# Patient Record
Sex: Female | Born: 1935 | Race: White | Hispanic: No | Smoking: Never smoker
Health system: Southern US, Community
[De-identification: ages and names within clinical notes are randomized; demographics above are authoritative.]

## PROBLEM LIST (undated history)

## (undated) DIAGNOSIS — T148XXA Other injury of unspecified body region, initial encounter: Secondary | ICD-10-CM

## (undated) DIAGNOSIS — H409 Unspecified glaucoma: Secondary | ICD-10-CM

## (undated) DIAGNOSIS — I10 Essential (primary) hypertension: Secondary | ICD-10-CM

## (undated) DIAGNOSIS — E785 Hyperlipidemia, unspecified: Secondary | ICD-10-CM

## (undated) HISTORY — DX: Other injury of unspecified body region, initial encounter: T14.8XXA

## (undated) HISTORY — PX: MOHS SURGERY: SUR867

## (undated) HISTORY — DX: Unspecified glaucoma: H40.9

## (undated) HISTORY — PX: HAND SURGERY: SHX662

## (undated) HISTORY — DX: Hyperlipidemia, unspecified: E78.5

## (undated) HISTORY — DX: Essential (primary) hypertension: I10

---

## 2005-04-20 ENCOUNTER — Ambulatory Visit: Payer: Self-pay | Admitting: Family Medicine

## 2005-07-15 ENCOUNTER — Ambulatory Visit: Payer: Self-pay | Admitting: Ophthalmology

## 2005-07-21 ENCOUNTER — Ambulatory Visit: Payer: Self-pay | Admitting: Ophthalmology

## 2006-06-16 ENCOUNTER — Ambulatory Visit: Payer: Self-pay | Admitting: Family Medicine

## 2009-01-08 ENCOUNTER — Ambulatory Visit: Payer: Self-pay | Admitting: Family Medicine

## 2009-05-23 DIAGNOSIS — L720 Epidermal cyst: Secondary | ICD-10-CM | POA: Insufficient documentation

## 2010-06-30 ENCOUNTER — Ambulatory Visit: Payer: Self-pay | Admitting: Family Medicine

## 2010-07-01 ENCOUNTER — Inpatient Hospital Stay: Payer: Self-pay | Admitting: Surgery

## 2010-07-08 LAB — PATHOLOGY REPORT

## 2010-08-12 ENCOUNTER — Ambulatory Visit: Payer: Self-pay | Admitting: Surgery

## 2010-12-21 ENCOUNTER — Ambulatory Visit: Payer: Self-pay | Admitting: Internal Medicine

## 2011-07-15 HISTORY — PX: CHOLECYSTECTOMY: SHX55

## 2013-06-28 ENCOUNTER — Ambulatory Visit: Payer: Self-pay | Admitting: Family Medicine

## 2013-07-09 ENCOUNTER — Ambulatory Visit: Payer: Self-pay | Admitting: Family Medicine

## 2013-07-30 ENCOUNTER — Ambulatory Visit: Payer: Self-pay | Admitting: Family Medicine

## 2013-07-30 HISTORY — PX: BREAST BIOPSY: SHX20

## 2013-08-15 ENCOUNTER — Ambulatory Visit (INDEPENDENT_AMBULATORY_CARE_PROVIDER_SITE_OTHER): Payer: Medicare Other | Admitting: General Surgery

## 2013-08-15 ENCOUNTER — Other Ambulatory Visit: Payer: Medicare Other

## 2013-08-15 ENCOUNTER — Encounter: Payer: Self-pay | Admitting: General Surgery

## 2013-08-15 VITALS — BP 132/70 | HR 70 | Resp 12 | Ht 65.0 in | Wt 135.0 lb

## 2013-08-15 DIAGNOSIS — N63 Unspecified lump in unspecified breast: Secondary | ICD-10-CM

## 2013-08-15 DIAGNOSIS — N6089 Other benign mammary dysplasias of unspecified breast: Secondary | ICD-10-CM

## 2013-08-15 DIAGNOSIS — N6082 Other benign mammary dysplasias of left breast: Secondary | ICD-10-CM

## 2013-08-15 NOTE — Progress Notes (Signed)
Patient ID: Felicia Weaver, female   DOB: 25-Dec-1924, 79 y.o.   MRN: [id number]  Chief Complaint  Patient presents with  . Breast Problem    left breast    HPI Felicia Weaver is a 79 y.o. female.  who presents for a breast evaluation referred by Dr Sullivan Lone. The most recent mammogram was 06-28-13.  She had a breast ultrasound done 07-09-13.  The patient then had a stereotatic left breast biopsy done 07-30-13.  Patient does perform regular self breast checks and her last regular mammogram was 2010.  She denied feeling anything in the breast and no breast injury. No family history breast cancer. T. 2014 was the first mammogram in 4 years.   HPI  Past Medical History  Diagnosis Date  . Glaucoma   . Fracture     right arm    Past Surgical History  Procedure Laterality Date  . Breast biopsy Left 07-30-13    stereotatic  . Cholecystectomy  Nov 2012    Family History  Problem Relation Age of Onset  . Hypertension Sister     Social History History  Substance Use Topics  . Smoking status: Never Smoker   . Smokeless tobacco: Not on file  . Alcohol Use: No    No Known Allergies  Current Outpatient Prescriptions  Medication Sig Dispense Refill  . aspirin 81 MG tablet Take 81 mg by mouth daily.      . Calcium-Magnesium-Vitamin D (CALCIUM 500 PO) Take by mouth daily.      . hydrochlorothiazide (HYDRODIURIL) 25 MG tablet Take 25 mg by mouth daily.      . Multiple Vitamin (MULTIVITAMIN) capsule Take 1 capsule by mouth daily.      . timolol (BETIMOL) 0.5 % ophthalmic solution Place 1 drop into the left eye daily.       No current facility-administered medications for this visit.    Review of Systems Review of Systems  Constitutional: Negative.   Respiratory: Negative.   Cardiovascular: Negative.     Blood pressure 132/70, pulse 70, resp. rate 12, height 5\' 5"  (1.651 m), weight 135 lb (61.236 kg).  Physical Exam Physical Exam  Constitutional: She is oriented to person,  place, and time. She appears well-developed and well-nourished.  Eyes: Conjunctivae are normal. No scleral icterus.  Neck: Neck supple.  Cardiovascular: Normal rate, regular rhythm and normal heart sounds.   Pulmonary/Chest: Effort normal and breath sounds normal. Right breast exhibits no inverted nipple, no mass, no nipple discharge, no skin change and no tenderness. Left breast exhibits no inverted nipple, no mass, no nipple discharge, no skin change and no tenderness.  Left breast with moderate amount of bruising noted.  Lymphadenopathy:    She has no cervical adenopathy.    She has no axillary adenopathy.  Neurological: She is alert and oriented to person, place, and time.  Skin: Skin is warm and dry.    Data Reviewed The 2014 and 2010 mammograms were reviewed. The area were the recent atypia was identified was not visualized on the 2010 study. Pathology shows focal flat epithelial atypia with microcalcification, fibrocystic changes and columnar cell changes.  Ultrasound examination of the left breast in the 3:00 position 5 cm from the nipple shows a well-defined biopsy cavity measuring 0.9 x 0.93 x 2.1 cm. The biopsy clip is evident.  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  Excellent functional status.     Plan    Options for management  were reviewed: 1) wide excision to confirm there is not a more sinister process present versus 2) followup mammograms to determine if further progression occurs.  The patient has an excellent functional status. She has several siblings who are in their 90s. The risks associated with surgery are modest and it may put her mind obese, if she were to defer an argument could be made for observation.  The patient's daughter, Felicia Weaver was present today for for the interview and exam. She and her mother will consider their options and notify the office followed like to proceed.        Felicia Weaver 08/17/2013, 9:02  PM

## 2013-08-15 NOTE — Patient Instructions (Addendum)
Continue self breast exams. Call office for any new breast issues or concerns. The patient will decide if she wishes to have breast surgery or to monitor the area and repeat the mammogram in 6 months.Felicia Weaver

## 2013-08-17 ENCOUNTER — Telehealth: Payer: Self-pay

## 2013-08-17 DIAGNOSIS — N6089 Other benign mammary dysplasias of unspecified breast: Secondary | ICD-10-CM | POA: Insufficient documentation

## 2013-08-17 NOTE — Telephone Encounter (Signed)
Patient's daughter called and states that the patient would like to go ahead and precede with the breast surgery now instead of waiting 6 months. Please write the order and give to [facility] and we will contact the patient to set up a surgery date. The patient would like to have her surgery done before Christmas.

## 2013-08-20 ENCOUNTER — Telehealth: Payer: Self-pay | Admitting: *Deleted

## 2013-08-20 NOTE — Telephone Encounter (Signed)
Patient's surgery has been scheduled for 08-28-13 at [facility]. It is okay for patient to continue 81 mg aspirin.  Daughter is aware of instructions and will pick up paperwork on Wednesday morning prior to pre-admit appointment.   Please complete orders so I can send to [facility]. Thanks.

## 2013-08-21 ENCOUNTER — Other Ambulatory Visit: Payer: Self-pay | Admitting: General Surgery

## 2013-08-21 DIAGNOSIS — N6082 Other benign mammary dysplasias of left breast: Secondary | ICD-10-CM

## 2013-08-21 NOTE — Telephone Encounter (Signed)
OK to continue ASA.

## 2013-08-22 ENCOUNTER — Ambulatory Visit: Payer: Self-pay | Admitting: General Surgery

## 2013-08-28 ENCOUNTER — Ambulatory Visit: Payer: Self-pay | Admitting: General Surgery

## 2013-08-28 DIAGNOSIS — D4860 Neoplasm of uncertain behavior of unspecified breast: Secondary | ICD-10-CM

## 2013-08-28 HISTORY — PX: BREAST BIOPSY: SHX20

## 2013-08-30 ENCOUNTER — Encounter: Payer: Self-pay | Admitting: General Surgery

## 2013-08-31 ENCOUNTER — Telehealth: Payer: Self-pay | Admitting: *Deleted

## 2013-08-31 NOTE — Telephone Encounter (Signed)
Patients daughter Alona Bene  has been notified as instructed. This patients daughter verbalizes understanding.Patient to follow up 09/04/13 at 1:45.

## 2013-08-31 NOTE — Telephone Encounter (Signed)
Message copied by Levada Schilling on Fri Aug 31, 2013  8:27 AM ------      Message from: Erick, Merrily Pew      Created: Fri Aug 31, 2013  7:28 AM       Please notify the patient that the breast biopsy from December 16 did not show any cancer. Will give further details during her followup appointment next week. No additional treatment is required. ------

## 2013-09-03 ENCOUNTER — Encounter: Payer: Self-pay | Admitting: General Surgery

## 2013-09-04 ENCOUNTER — Encounter: Payer: Self-pay | Admitting: General Surgery

## 2013-09-04 ENCOUNTER — Ambulatory Visit (INDEPENDENT_AMBULATORY_CARE_PROVIDER_SITE_OTHER): Payer: Medicare Other | Admitting: General Surgery

## 2013-09-04 VITALS — BP 124/68 | HR 72 | Resp 14 | Ht 65.0 in | Wt 137.0 lb

## 2013-09-04 DIAGNOSIS — N6082 Other benign mammary dysplasias of left breast: Secondary | ICD-10-CM

## 2013-09-04 DIAGNOSIS — N6089 Other benign mammary dysplasias of unspecified breast: Secondary | ICD-10-CM

## 2013-09-04 NOTE — Patient Instructions (Addendum)
Patient to return in one month. 

## 2013-09-04 NOTE — Progress Notes (Signed)
Patient ID: Felicia Weaver, female   DOB: [birth year], 79 y.o.   MRN: [id number]  Chief Complaint  Patient presents with  . Routine Post Op    left breast biopsy    HPI Felicia Weaver is a 79 y.o. female here today for her post op left breast biopsy done 08/28/13. No new breast issues.  States the left breast is "sore" but no pain.  HPI  Past Medical History  Diagnosis Date  . Glaucoma   . Fracture     right arm    Past Surgical History  Procedure Laterality Date  . Cholecystectomy  Nov 2012  . Breast biopsy Left 07-30-13    stereotatic  . Breast biopsy Left 08/28/13    benign    Family History  Problem Relation Age of Onset  . Hypertension Sister     Social History History  Substance Use Topics  . Smoking status: Never Smoker   . Smokeless tobacco: Not on file  . Alcohol Use: No    No Known Allergies  Current Outpatient Prescriptions  Medication Sig Dispense Refill  . aspirin 81 MG tablet Take 81 mg by mouth daily.      . Calcium-Magnesium-Vitamin D (CALCIUM 500 PO) Take by mouth daily.      . hydrochlorothiazide (HYDRODIURIL) 25 MG tablet Take 25 mg by mouth daily.      . Multiple Vitamin (MULTIVITAMIN) capsule Take 1 capsule by mouth daily.      . timolol (BETIMOL) 0.5 % ophthalmic solution Place 1 drop into the left eye daily.       No current facility-administered medications for this visit.    Review of Systems Review of Systems  Constitutional: Negative.   Respiratory: Negative.   Cardiovascular: Negative.     Blood pressure 124/68, pulse 72, resp. rate 14, height 5\' 5"  (1.651 m), weight 137 lb (62.143 kg).  Physical Exam Physical Exam  Constitutional: She is oriented to person, place, and time. She appears well-developed and well-nourished.  Eyes: No scleral icterus.  Pulmonary/Chest:  Left breast incision is healing well.  Lymphadenopathy:    She has no cervical adenopathy.  Neurological: She is alert and oriented to person, place, and time.   Skin: Skin is warm and dry.    Data Reviewed Flat epithelial atypia without severe dysplasia or malignancy. Surgical margins free of atypia.   Assessment    Doing well status post excisional biopsy.     Plan    followup examination one month.        [name] 09/04/2013, 9:03 PM

## 2013-10-10 ENCOUNTER — Encounter: Payer: Self-pay | Admitting: General Surgery

## 2013-10-10 ENCOUNTER — Ambulatory Visit (INDEPENDENT_AMBULATORY_CARE_PROVIDER_SITE_OTHER): Payer: Medicare HMO | Admitting: General Surgery

## 2013-10-10 VITALS — BP 132/70 | HR 70 | Resp 12 | Ht 65.0 in | Wt 134.0 lb

## 2013-10-10 DIAGNOSIS — R921 Mammographic calcification found on diagnostic imaging of breast: Secondary | ICD-10-CM

## 2013-10-10 DIAGNOSIS — R928 Other abnormal and inconclusive findings on diagnostic imaging of breast: Secondary | ICD-10-CM

## 2013-10-10 NOTE — Progress Notes (Signed)
Patient ID: Felicia Weaver, female   DOB: 10-Oct-1924, 80 y.o.   MRN: [id number]  Chief Complaint  Patient presents with  . Follow-up    HPI Felicia Weaver is a 80 y.o. female.  Here today for her follow up excisional left breast biopsy done 08-28-13. No new breast complaints. She does say the left breast is still tender.  HPI  Past Medical History  Diagnosis Date  . Glaucoma   . Fracture     right arm    Past Surgical History  Procedure Laterality Date  . Cholecystectomy  Nov 2012  . Breast biopsy Left 07-30-13    stereotatic  . Breast biopsy Left 08/28/13    benign/Flat epithelial atypia without severe dysplasia or malignancy. Surgical margins free of atypia    Family History  Problem Relation Age of Onset  . Hypertension Sister     Social History History  Substance Use Topics  . Smoking status: Never Smoker   . Smokeless tobacco: Not on file  . Alcohol Use: No    No Known Allergies  Current Outpatient Prescriptions  Medication Sig Dispense Refill  . aspirin 81 MG tablet Take 81 mg by mouth daily.      . Calcium-Magnesium-Vitamin D (CALCIUM 500 PO) Take by mouth daily.      . hydrochlorothiazide (HYDRODIURIL) 25 MG tablet Take 25 mg by mouth daily.      . Multiple Vitamin (MULTIVITAMIN) capsule Take 1 capsule by mouth daily.      . timolol (BETIMOL) 0.5 % ophthalmic solution Place 1 drop into the left eye daily.       No current facility-administered medications for this visit.    Review of Systems Review of Systems  Constitutional: Negative.   Respiratory: Negative.   Cardiovascular: Negative.     Blood pressure 132/70, pulse 70, resp. rate 12, height 5\' 5"  (1.651 m), weight 134 lb (60.782 kg).  Physical Exam Physical Exam  Constitutional: She is oriented to person, place, and time. She appears well-developed and well-nourished.  Pulmonary/Chest:  Left breast incision healing well.   Neurological: She is alert and oriented to person, place, and time.   Skin: Skin is warm and dry.    Data Reviewed None.  Assessment    Doing well status post biopsy for flat epithelial Atypia without malignancy.     Plan    Return in June 2015 for right diagnostic mammogram and office visit.       Robert Bellow 10/10/2013, 9:36 PM

## 2013-10-10 NOTE — Patient Instructions (Signed)
Continue self breast exams. Call office for any new breast issues or concerns. 

## 2013-10-17 ENCOUNTER — Encounter: Payer: Self-pay | Admitting: *Deleted

## 2014-02-18 ENCOUNTER — Other Ambulatory Visit: Payer: Self-pay | Admitting: Interventional Radiology

## 2014-02-19 ENCOUNTER — Encounter: Payer: Self-pay | Admitting: General Surgery

## 2014-02-19 ENCOUNTER — Telehealth: Payer: Self-pay | Admitting: *Deleted

## 2014-02-19 ENCOUNTER — Ambulatory Visit: Payer: Self-pay | Admitting: General Surgery

## 2014-02-19 NOTE — Telephone Encounter (Signed)
Felicia Weaver is calling you back about this pt, she has been talking to you about a letter you sent to her in regards to insurance change. The best number to reach [name] today is [phone]

## 2014-03-06 ENCOUNTER — Ambulatory Visit (INDEPENDENT_AMBULATORY_CARE_PROVIDER_SITE_OTHER): Payer: Medicare HMO | Admitting: General Surgery

## 2014-03-06 ENCOUNTER — Encounter: Payer: Self-pay | Admitting: General Surgery

## 2014-03-06 VITALS — BP 110/62 | HR 60 | Resp 12 | Ht 65.0 in | Wt 132.0 lb

## 2014-03-06 DIAGNOSIS — N63 Unspecified lump in unspecified breast: Secondary | ICD-10-CM

## 2014-03-06 DIAGNOSIS — R928 Other abnormal and inconclusive findings on diagnostic imaging of breast: Secondary | ICD-10-CM

## 2014-03-06 DIAGNOSIS — R921 Mammographic calcification found on diagnostic imaging of breast: Secondary | ICD-10-CM

## 2014-03-06 NOTE — Patient Instructions (Signed)
Continue self breast exams. Call office for any new breast issues or concerns. 

## 2014-03-06 NOTE — Progress Notes (Signed)
Patient ID: DAURICE OVANDO, female   DOB: 1925/05/01, 80 y.o.   MRN: [id number]  Chief Complaint  Patient presents with  . Follow-up    mammogram    HPI Felicia Weaver is a 80 y.o. female who presents for a breast evaluation. The most recent mammogram was done on 02/19/14 Patient does perform regular self breast checks and gets regular mammograms done.  No new breast issues.  HPI  Past Medical History  Diagnosis Date  . Glaucoma   . Fracture     right arm    Past Surgical History  Procedure Laterality Date  . Cholecystectomy  Nov 2012  . Breast biopsy Left 07-30-13    stereotatic  . Breast biopsy Left 08/28/13    benign/Flat epithelial atypia without severe dysplasia or malignancy. Surgical margins free of atypia    Family History  Problem Relation Age of Onset  . Hypertension Sister     Social History History  Substance Use Topics  . Smoking status: Never Smoker   . Smokeless tobacco: Not on file  . Alcohol Use: No    Allergies  Allergen Reactions  . Brimonidine Tartrate Other (See Comments)    Itching eyes  . Other Other (See Comments)    PROPINE. PROPINE -itching eyes    Current Outpatient Prescriptions  Medication Sig Dispense Refill  . aspirin 81 MG tablet Take 81 mg by mouth daily.      . Calcium-Magnesium-Vitamin D (CALCIUM 500 PO) Take by mouth daily.      . hydrochlorothiazide (HYDRODIURIL) 25 MG tablet Take 25 mg by mouth daily.      . Multiple Vitamin (MULTIVITAMIN) capsule Take 1 capsule by mouth daily.      . timolol (BETIMOL) 0.5 % ophthalmic solution Place 1 drop into the left eye daily.       No current facility-administered medications for this visit.    Review of Systems Review of Systems  Constitutional: Negative.   Respiratory: Negative.   Cardiovascular: Negative.     Blood pressure 110/62, pulse 60, resp. rate 12, height 5\' 5"  (1.651 m), weight 132 lb (59.875 kg).  Physical Exam Physical Exam  Constitutional: She is oriented to  person, place, and time. She appears well-developed and well-nourished.  Neck: Neck supple.  Cardiovascular: Normal rate, regular rhythm and normal heart sounds.   Pulmonary/Chest: Effort normal and breath sounds normal. Right breast exhibits no inverted nipple, no mass, no nipple discharge, no skin change and no tenderness. Left breast exhibits tenderness. Left breast exhibits no inverted nipple, no mass, no nipple discharge and no skin change.  Well healed incision left breast upper outer quadrant, tenderness at the scar  Lymphadenopathy:    She has no cervical adenopathy.    She has no axillary adenopathy.  Neurological: She is alert and oriented to person, place, and time.  Skin: Skin is warm and dry.    Data Reviewed 08/28/2013 biopsy results for wide excision:  Flat epithelial atypia without severe dysplasia or malignancy. Surgical margins free of atypia. Left breast mammogram dated 02/19/2014 was reviewed. Previously identified calcifications are no longer evident. BI-RAD-2.  Assessment    Benign breast exam. Stable mammogram    Plan    Followup ear were begun as needed basis. The patient will continue annual screening mammograms (bilateral studies do later this year) with her primary care Shaneisha Burkel.     PCP: Philemon Kingdom 03/08/2014, 3:59 PM

## 2014-03-08 DIAGNOSIS — N63 Unspecified lump in unspecified breast: Secondary | ICD-10-CM | POA: Insufficient documentation

## 2014-06-04 LAB — TSH: TSH: 2.09 u[IU]/mL (ref 0.41–5.90)

## 2014-06-04 LAB — BASIC METABOLIC PANEL
BUN: 25 mg/dL — AB (ref 4–21)
CREATININE: 1.6 mg/dL — AB (ref 0.5–1.1)
Glucose: 103 mg/dL
Potassium: 4.5 mmol/L (ref 3.4–5.3)
SODIUM: 142 mmol/L (ref 137–147)

## 2014-06-04 LAB — LIPID PANEL
Cholesterol: 240 mg/dL — AB (ref 0–200)
HDL: 68 mg/dL (ref 35–70)
LDL Cholesterol: 150 mg/dL
LDL/HDL RATIO: 2.2
Triglycerides: 110 mg/dL (ref 40–160)

## 2014-06-04 LAB — HEPATIC FUNCTION PANEL
ALK PHOS: 67 U/L (ref 25–125)
ALT: 16 U/L (ref 7–35)
AST: 20 U/L (ref 13–35)

## 2014-06-04 LAB — CBC AND DIFFERENTIAL
HCT: 41 % (ref 36–46)
Hemoglobin: 13.3 g/dL (ref 12.0–16.0)
Neutrophils Absolute: 3 /uL
Platelets: 193 10*3/uL (ref 150–399)
WBC: 5.1 10^3/mL

## 2014-07-15 ENCOUNTER — Encounter: Payer: Self-pay | Admitting: General Surgery

## 2014-12-28 LAB — HEMOGLOBIN A1C: Hgb A1c MFr Bld: 5.7 % (ref 4.0–6.0)

## 2015-01-03 NOTE — Op Note (Signed)
PATIENT NAME:  Felicia Weaver, Felicia Weaver MR#:  [id number] DATE OF BIRTH:  04/28/1925  DATE OF PROCEDURE:  08/28/2013  PREOPERATIVE DIAGNOSIS: Atypical hyperplasia, left breast.   POSTOPERATIVE DIAGNOSIS: Atypical hyperplasia, left breast.   OPERATIVE PROCEDURE: Ultrasound-guided biopsy, left breast.   SURGEON: Robert Bellow, MD  ANESTHESIA: General by LMA under Dr. Carolin Sicks, Marcaine 0.5% with 1:200,000 units epinephrine, 25 mL local infiltration.   ESTIMATED BLOOD LOSS: Minimal.   CLINICAL NOTE: This 81 year old woman had Weaver mammographic abnormality, for which Weaver stereotactic biopsy was completed by the radiology service. This showed evidence of atypical hyperplasia. She was considered Weaver candidate for wide excision due to her excellent physical status and expected long-term longevity.   OPERATIVE NOTE: With the patient under adequate general anesthesia, the breast was prepped with ChloraPrep and draped. Ultrasound was used to identify the previously placed biopsy clip. This was fairly close to the skin and associated with dimpling from the biopsy itself. Weaver crescent-shaped ellipse of skin was resected from the 1 to 3 o'clock position approximately 6 cm from the nipple. The skin and subcutaneous tissue was divided sharply and the remaining dissection completed with electrocautery. This was extended down to the fascia. The specimen was orientated, and specimen radiograph showed the previously placed clip in the center of the specimen. It was then sent for routine histology. The breast was elevated off the underlying pectoralis fascia and approximated with interrupted 2-0 Vicryl figure-of-eight sutures. This was completed in multiple layers. The skin was then closed with Weaver running 4-0 Vicryl subcuticular suture. Marcaine had been infiltrated at the beginning of the procedure. Benzoin and Steri-Strips followed by Weaver Telfa dressing, fluff gauze, Kerlix, and an Ace wrap was then applied.   The patient tolerated  the procedure well and was taken to the recovery room in stable condition.    ____________________________ Robert Bellow, MD jwb:jcm D: 08/28/2013 14:05:54 ET T: 08/28/2013 15:03:40 ET JOB#: [id number]  cc: Robert Bellow, MD, <Dictator> Richard L. Rosanna Randy, MD JEFFREY Amedeo Kinsman MD ELECTRONICALLY SIGNED 08/29/2013 9:16

## 2015-02-24 ENCOUNTER — Encounter: Payer: Self-pay | Admitting: Family Medicine

## 2015-07-17 DIAGNOSIS — E782 Mixed hyperlipidemia: Secondary | ICD-10-CM | POA: Insufficient documentation

## 2015-07-17 DIAGNOSIS — L309 Dermatitis, unspecified: Secondary | ICD-10-CM | POA: Insufficient documentation

## 2015-07-17 DIAGNOSIS — R739 Hyperglycemia, unspecified: Secondary | ICD-10-CM | POA: Insufficient documentation

## 2015-07-17 DIAGNOSIS — E039 Hypothyroidism, unspecified: Secondary | ICD-10-CM | POA: Insufficient documentation

## 2015-07-17 DIAGNOSIS — M8580 Other specified disorders of bone density and structure, unspecified site: Secondary | ICD-10-CM | POA: Insufficient documentation

## 2015-07-17 DIAGNOSIS — I10 Essential (primary) hypertension: Secondary | ICD-10-CM | POA: Insufficient documentation

## 2015-07-22 ENCOUNTER — Encounter: Payer: Self-pay | Admitting: Family Medicine

## 2015-07-22 ENCOUNTER — Ambulatory Visit (INDEPENDENT_AMBULATORY_CARE_PROVIDER_SITE_OTHER): Payer: Commercial Managed Care - HMO | Admitting: Family Medicine

## 2015-07-22 VITALS — BP 138/60 | HR 68 | Temp 97.6°F | Resp 16 | Ht 63.0 in | Wt 128.0 lb

## 2015-07-22 DIAGNOSIS — Z23 Encounter for immunization: Secondary | ICD-10-CM

## 2015-07-22 DIAGNOSIS — E538 Deficiency of other specified B group vitamins: Secondary | ICD-10-CM | POA: Diagnosis not present

## 2015-07-22 DIAGNOSIS — H1030 Unspecified acute conjunctivitis, unspecified eye: Secondary | ICD-10-CM | POA: Diagnosis not present

## 2015-07-22 DIAGNOSIS — Z0000 Encounter for general adult medical examination without abnormal findings: Secondary | ICD-10-CM

## 2015-07-22 DIAGNOSIS — I10 Essential (primary) hypertension: Secondary | ICD-10-CM

## 2015-07-22 DIAGNOSIS — R634 Abnormal weight loss: Secondary | ICD-10-CM

## 2015-07-22 DIAGNOSIS — R739 Hyperglycemia, unspecified: Secondary | ICD-10-CM | POA: Diagnosis not present

## 2015-07-22 DIAGNOSIS — E039 Hypothyroidism, unspecified: Secondary | ICD-10-CM | POA: Diagnosis not present

## 2015-07-22 DIAGNOSIS — R5383 Other fatigue: Secondary | ICD-10-CM

## 2015-07-22 NOTE — Progress Notes (Signed)
Patient ID: KATELYNN HEIDLER, female   DOB: 21-Mar-1925, 81 y.o.   MRN: [id number] Patient: Felicia Weaver, Female    DOB: 09-20-1924, 81 y.o.   MRN: [id number] Visit Date: 07/22/2015  Today's Provider: [name], MD   Chief Complaint  Patient presents with  . Annual Exam   Subjective:   Felicia Weaver is a 81 y.o. female who presents today for her Subsequent Annual Wellness Visit. She feels well. She reports she is not exercising. She reports she is sleeping well.  Mammogram- 07/30/13 BMD- 01/26/06 Osteopenia EKG- 04/11/12 Pneum 23-11/10/07 Prevnar- 06/04/14 Td-12/27/08 Zoster- 04/26/12    Review of Systems  Constitutional: Negative.   HENT: Positive for ear discharge.   Eyes: Positive for pain, discharge and redness.  Respiratory: Negative.   Cardiovascular: Negative.   Gastrointestinal: Negative.   Endocrine: Positive for cold intolerance.  Genitourinary: Negative.   Musculoskeletal: Negative.   Skin: Negative.   Allergic/Immunologic: Negative.   Neurological: Negative.   Hematological: Bruises/bleeds easily.  Psychiatric/Behavioral: Negative.     Patient Active Problem List   Diagnosis Date Noted  . Adult hypothyroidism 07/17/2015  . Dermatitis, eczematoid 07/17/2015  . BP (high blood pressure) 07/17/2015  . Blood glucose elevated 07/17/2015  . Combined fat and carbohydrate induced hyperlipemia 07/17/2015  . Osteopenia 07/17/2015  . Breast mass 03/08/2014  . Breast calcifications 10/10/2013  . Flat epithelial atypia of breast 08/17/2013  . Atheroma, skin 05/23/2009  . Glaucoma 05/03/2008    Social History   Social History  . Marital Status: Widowed    Spouse Name: N/A  . Number of Children: N/A  . Years of Education: N/A   Occupational History  . Not on file.   Social History Main Topics  . Smoking status: Never Smoker   . Smokeless tobacco: Not on file  . Alcohol Use: No  . Drug Use: No  . Sexual Activity: Not on file   Other Topics Concern  .  Not on file   Social History Narrative    Past Surgical History  Procedure Laterality Date  . Cholecystectomy  Nov 2012  . Breast biopsy Left 07-30-13    stereotatic  . Breast biopsy Left 08/28/13    benign/Flat epithelial atypia without severe dysplasia or malignancy. Surgical margins free of atypia  . Hand surgery    . Mohs surgery      Her family history includes Cancer in her brother; Dementia in her mother and sister; Heart attack in her brother; Heart disease in her sister; Hypertension in her sister; Stroke in her brother and father.    Outpatient Prescriptions Prior to Visit  Medication Sig Dispense Refill  . aspirin 81 MG tablet Take by mouth.    Marland Kitchen b complex vitamins capsule Take by mouth.    . Calcium-Magnesium-Vitamin D (CALCIUM 500 PO) Take by mouth daily.    . fluocinonide (LIDEX) 0.05 % external solution FLUOCINONIDE, 0.05% (External Solution)  1 (one) Solution Solution to scalp PRN for Eczema for 0 days  Quantity: 60;  Refills: 6   Ordered :04-Jun-2014  Celene Kras, MA, Anastasiya ;  Started 04-Jun-2014 Active    . hydrochlorothiazide (HYDRODIURIL) 25 MG tablet Take 25 mg by mouth daily.    . Multiple Vitamins tablet Take by mouth.    . timolol (BETIMOL) 0.5 % ophthalmic solution Place 1 drop into the left eye daily.    . Timolol Maleate PF 0.5 % SOLN Apply to eye.    Marland Kitchen aspirin 81 MG tablet Take  81 mg by mouth daily.    . calcium carbonate (OS-CAL) 600 MG TABS tablet Take by mouth.    . Multiple Vitamin (MULTIVITAMIN) capsule Take 1 capsule by mouth daily.     No facility-administered medications prior to visit.    Allergies  Allergen Reactions  . Brimonidine Tartrate Other (See Comments)    Not Assessed Itching eyes  . Betagan  [Levobunolol Hcl] Other (See Comments)    Not Assessed  . Other Other (See Comments)    PROPINE. PROPINE -itching eyes  . Sulfa Antibiotics Nausea Only    Patient Care Team: Jerrol Banana., MD as PCP - General  (Family Medicine) Robert Bellow, MD (General Surgery) Jerrol Banana., MD (Family Medicine)  Objective:   Vitals:  Filed Vitals:   07/22/15 1424  BP: 138/60  Pulse: 68  Temp: 97.6 F (36.4 C)  TempSrc: Oral  Resp: 16  Height: 5\' 3"  (1.6 m)  Weight: 128 lb (58.06 kg)    Physical Exam  Constitutional: She is oriented to person, place, and time. She appears well-developed and well-nourished.  HENT:  Head: Normocephalic and atraumatic.  Right Ear: External ear normal.  Left Ear: External ear normal.  Nose: Nose normal.  Eyes: Conjunctivae are normal.  Neck: Neck supple.  Cardiovascular: Normal rate, regular rhythm and normal heart sounds.   Pulmonary/Chest: Effort normal and breath sounds normal.  Abdominal: Soft.  Neurological: She is alert and oriented to person, place, and time.  Skin: Skin is warm and dry.  Psychiatric: Her behavior is normal. Judgment and thought content normal.    Activities of Daily Living In your present state of health, do you have any difficulty performing the following activities: 07/22/2015  Hearing? N  Vision? N  Difficulty concentrating or making decisions? Y  Walking or climbing stairs? N  Dressing or bathing? N  Doing errands, shopping? N    Fall Risk Assessment Fall Risk  07/22/2015  Falls in the past year? No     Depression Screen PHQ 2/9 Scores 07/22/2015 07/22/2015  PHQ - 2 Score 0 0  PHQ- 9 Score 13 -    Cognitive Testing - 6-CIT    Year: 0 points  Month: 0 points  Memorize "Pia Mau, [address], [place]"  Time (within 1 hour:) 0 points  Count backwards from 20: 0 points  Name months of year: 0 points  Repeat Address: 8 points   Total Score: 8/28  Interpretation : Normal (0-7) Abnormal (8-28)   Assessment & Plan:     Annual Wellness Visit  Reviewed patient's Family Medical History Reviewed and updated list of patient's medical providers Assessment of cognitive impairment was done Assessed  patient's functional ability Established a written schedule for health screening [place] Completed and Reviewed  Exercise Activities and Dietary recommendations Goals    None      Immunization History  Administered Date(s) Administered  . Pneumococcal Conjugate-13 06/04/2014  . Pneumococcal Polysaccharide-23 07/23/2006  . Td 12/27/2008  . Zoster 04/26/2012    Health Maintenance  Topic Date Due  . DEXA SCAN  04/16/1992  . INFLUENZA VACCINE  04/14/2015  . TETANUS/TDAP  12/28/2018  . ZOSTAVAX  Completed  . PNA vac Low Risk Adult  Completed      Discussed health benefits of physical activity, and encouraged her to engage in regular exercise appropriate for her age and condition.    Miguel Aschoff MD [facility] 07/22/2015  3:08 PM  ------------------------------------------------------------------------------------------------------------

## 2015-07-23 LAB — CBC WITH DIFFERENTIAL/PLATELET
BASOS ABS: 0.1 10*3/uL (ref 0.0–0.2)
BASOS: 1 %
EOS (ABSOLUTE): 0.1 10*3/uL (ref 0.0–0.4)
Eos: 1 %
HEMOGLOBIN: 13.3 g/dL (ref 11.1–15.9)
Hematocrit: 40.5 % (ref 34.0–46.6)
IMMATURE GRANS (ABS): 0 10*3/uL (ref 0.0–0.1)
IMMATURE GRANULOCYTES: 0 %
Lymphocytes Absolute: 1.6 10*3/uL (ref 0.7–3.1)
Lymphs: 19 %
MCH: 30.9 pg (ref 26.6–33.0)
MCHC: 32.8 g/dL (ref 31.5–35.7)
MCV: 94 fL (ref 79–97)
Monocytes Absolute: 0.7 10*3/uL (ref 0.1–0.9)
Monocytes: 8 %
NEUTROS ABS: 5.8 10*3/uL (ref 1.4–7.0)
Neutrophils: 71 %
Platelets: 242 10*3/uL (ref 150–379)
RBC: 4.3 x10E6/uL (ref 3.77–5.28)
RDW: 13 % (ref 12.3–15.4)
WBC: 8.1 10*3/uL (ref 3.4–10.8)

## 2015-07-23 LAB — COMPREHENSIVE METABOLIC PANEL
ALBUMIN: 4 g/dL (ref 3.5–4.7)
ALK PHOS: 76 IU/L (ref 39–117)
ALT: 12 IU/L (ref 0–32)
AST: 20 IU/L (ref 0–40)
Albumin/Globulin Ratio: 1.5 (ref 1.1–2.5)
BILIRUBIN TOTAL: 0.3 mg/dL (ref 0.0–1.2)
BUN / CREAT RATIO: 13 (ref 11–26)
BUN: 20 mg/dL (ref 8–27)
CHLORIDE: 97 mmol/L (ref 97–106)
CO2: 29 mmol/L (ref 18–29)
Calcium: 10.4 mg/dL — ABNORMAL HIGH (ref 8.7–10.3)
Creatinine, Ser: 1.49 mg/dL — ABNORMAL HIGH (ref 0.57–1.00)
GFR calc Af Amer: 36 mL/min/{1.73_m2} — ABNORMAL LOW (ref >59)
GFR calc non Af Amer: 31 mL/min/{1.73_m2} — ABNORMAL LOW (ref >59)
GLOBULIN, TOTAL: 2.6 g/dL (ref 1.5–4.5)
GLUCOSE: 110 mg/dL — AB (ref 65–99)
Potassium: 4.2 mmol/L (ref 3.5–5.2)
SODIUM: 140 mmol/L (ref 136–144)
Total Protein: 6.6 g/dL (ref 6.0–8.5)

## 2015-07-23 LAB — HEMOGLOBIN A1C
ESTIMATED AVERAGE GLUCOSE: 131 mg/dL
HEMOGLOBIN A1C: 6.2 % — AB (ref 4.8–5.6)

## 2015-07-23 LAB — VITAMIN B12: Vitamin B-12: 890 pg/mL (ref 211–946)

## 2015-07-23 LAB — TSH: TSH: 1.52 u[IU]/mL (ref 0.450–4.500)

## 2015-10-25 IMAGING — US ULTRASOUND LEFT BREAST
1 series · 4 of 4 positions shown · non-contrast
Comparison: With priors

CLINICAL DATA: Abnormal left screening mammogram

EXAM:
DIGITAL DIAGNOSTIC left MAMMOGRAM
ULTRASOUND left BREAST

[Series 1: ultrasound left breast · 0.08mm/px · 4 of 4 slices shown]
[im 1/4]
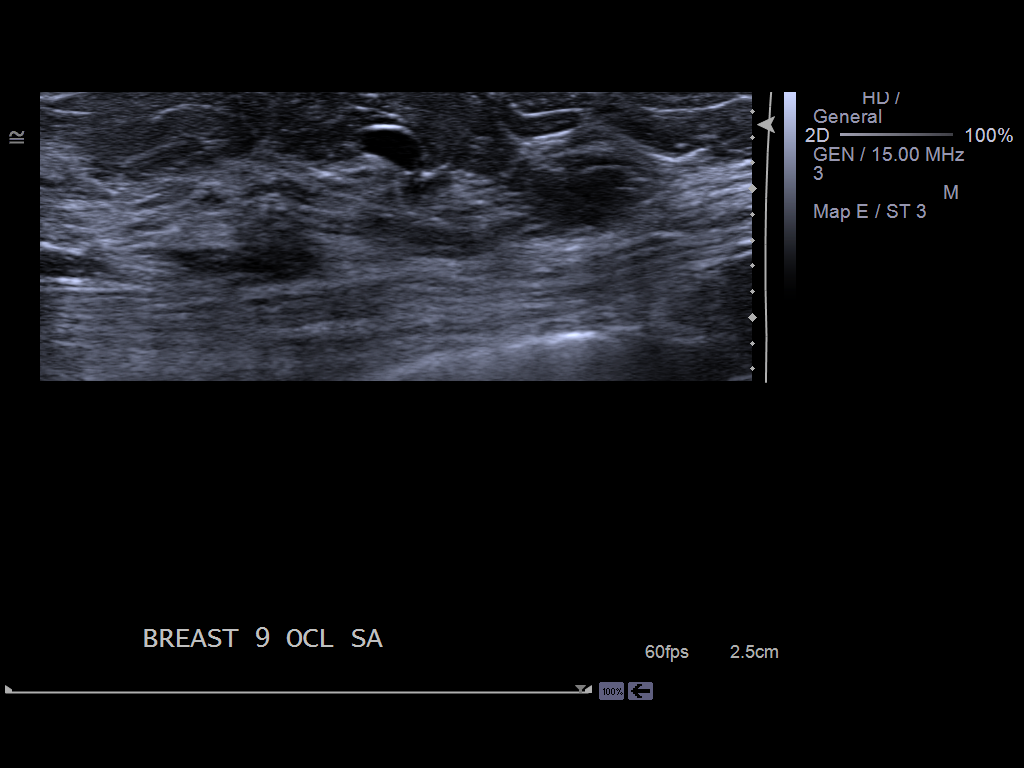
[im 2/4]
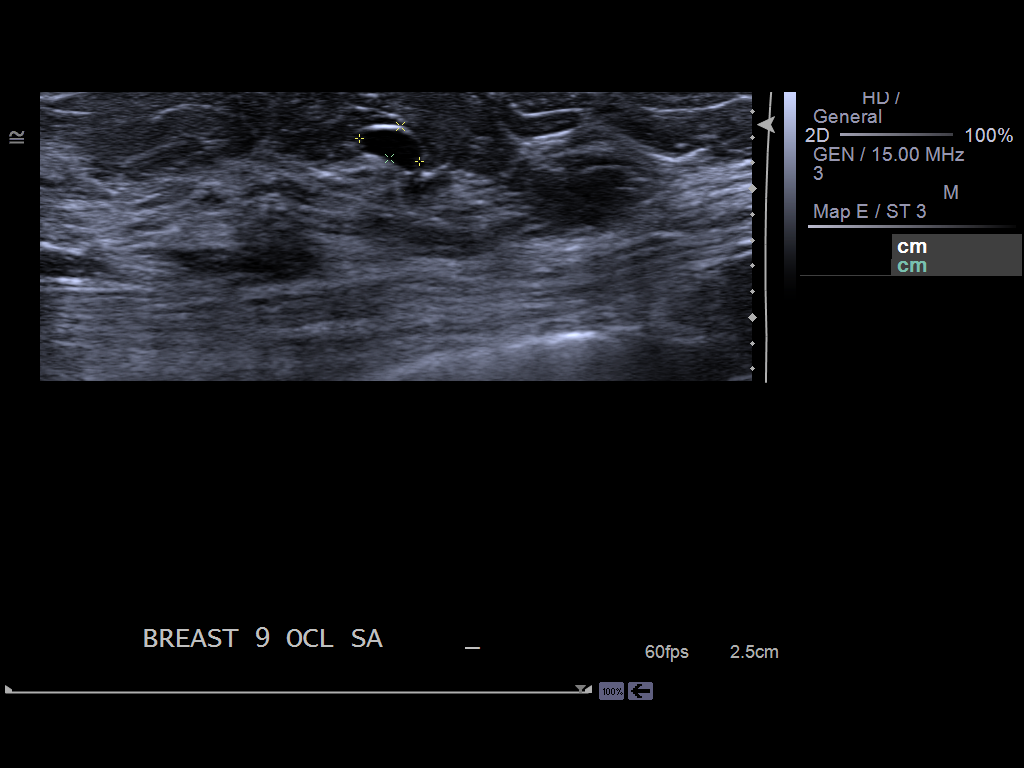
[im 3/4]
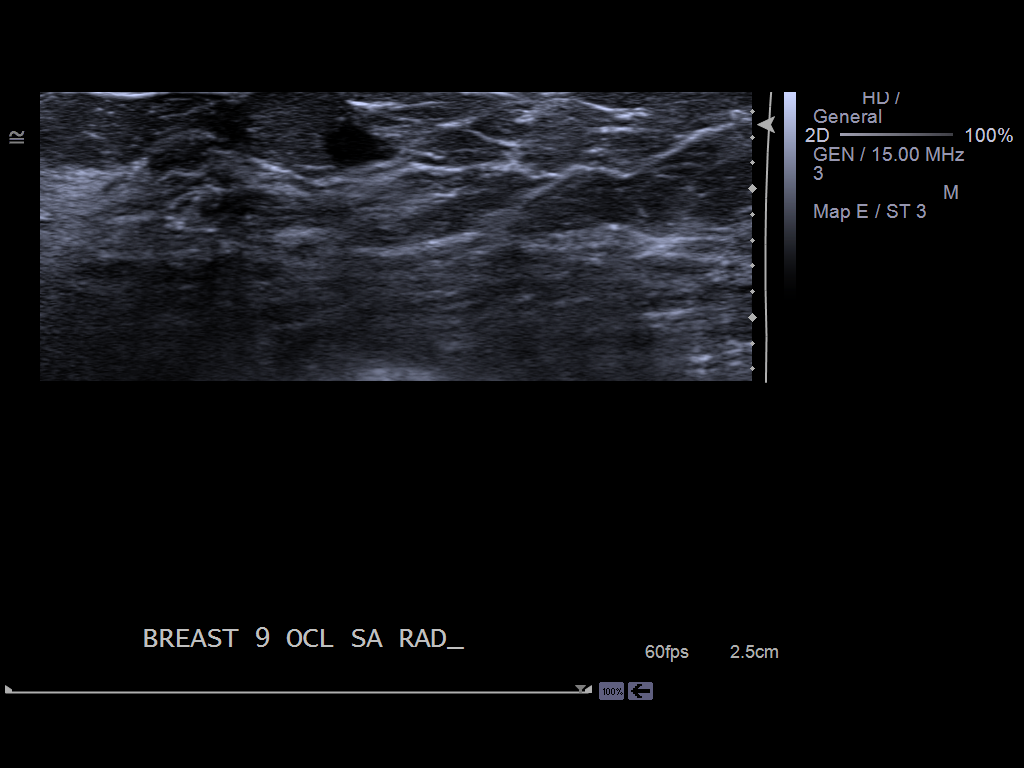
[im 4/4]
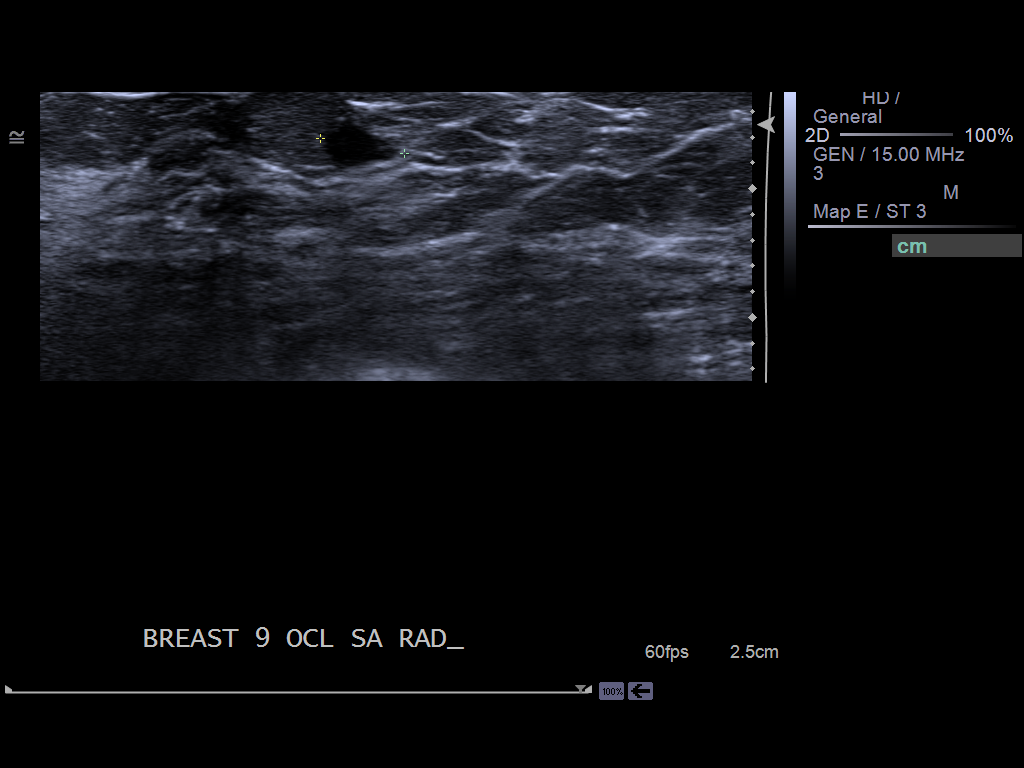

[4 of 4 positions shown; findings below may reference images not displayed]

FINDINGS: Spot compression views of the lower inner subareolar region of the
left breast was performed. There is a well-circumscribed 7 mm
nodule.

Magnification views of the upper-outer quadrant of the left breast
show developing 8 mm area of calcifications that very in size and
shape following a ductal pattern. There is no associated mass.

On physical exam I do not palpate a mass in the left breast.

Ultrasound is performed, showing there is a simple cyst in the left
breast at 9 o'clock in a subareolar location the measuring 5 x 3 x 7
mm.
IMPRESSION: Suspicious calcifications in the upper-outer quadrant of the left
breast.

ACR BREAST DENSITY:
ACR Breast Density Category b: There are scattered areas of
fibroglandular density.

RECOMMENDATION:
Stereotactic biopsy of the left breast is recommended. This will be
scheduled at the patient's convenience. The findings were discussed
with the patient and her daughter.

I have discussed the findings and recommendations with the patient.
Results were also provided in writing at the conclusion of the
visit. If applicable, a reminder letter will be sent to the patient
regarding the next appointment.

BI-RADS CATEGORY:
4: Suspicious abnormality - biopsy should be considered.

## 2015-10-25 IMAGING — MG MM ADDITIONAL VIEWS AT NO CHARGE
4 series · 7 of 7 positions shown · non-contrast
Comparison: With priors

ADDENDUM:
Stereotactic biopsy of the left breast has been scheduled on
07/30/2013.
CLINICAL DATA: Abnormal left screening mammogram

EXAM:
DIGITAL DIAGNOSTIC  left MAMMOGRAM
ULTRASOUND left BREAST

[L CC · left · 4 of 4 slices shown (1 of 2)]
[im 1/4]
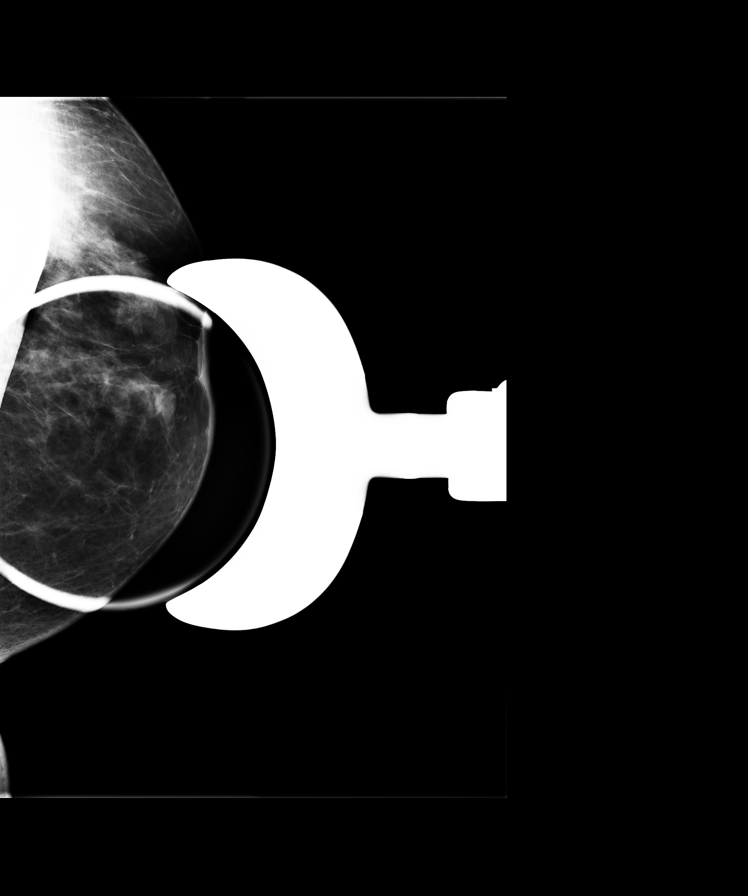
[im 2/4]
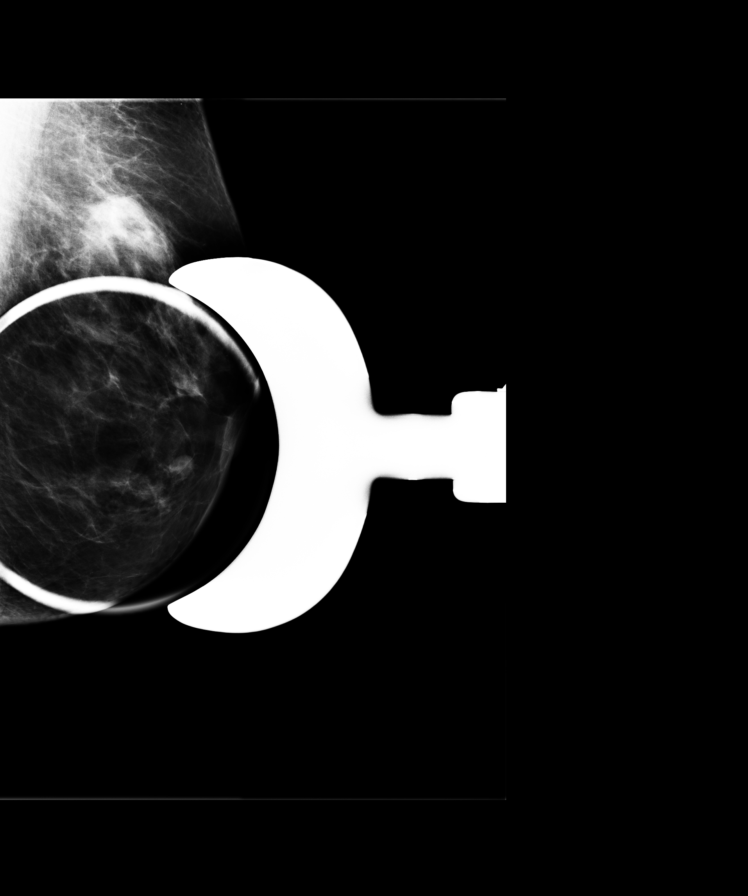
[im 3/4]
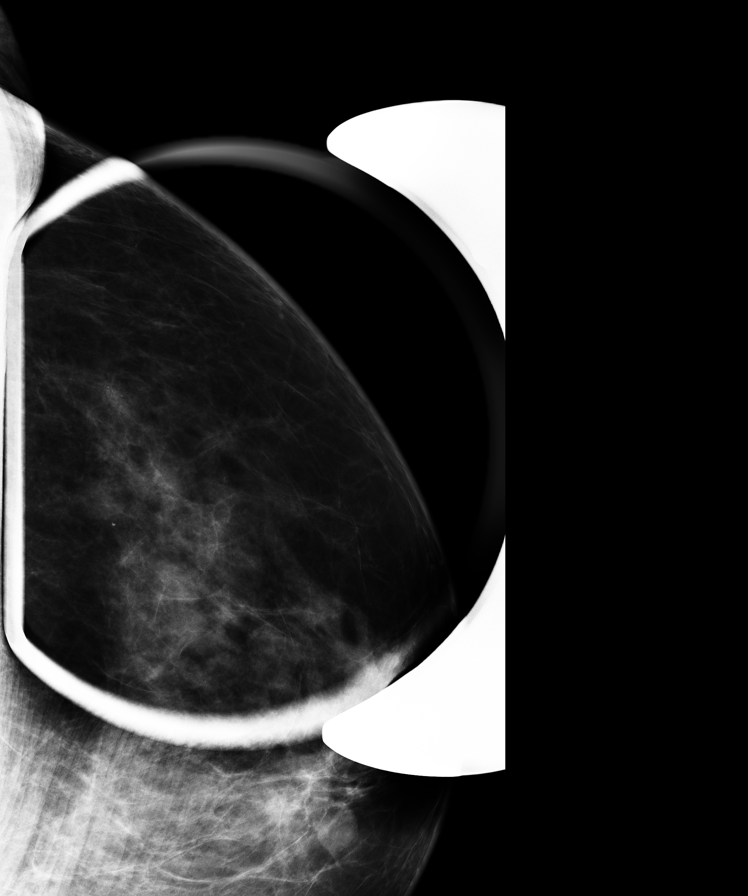
[im 4/4]
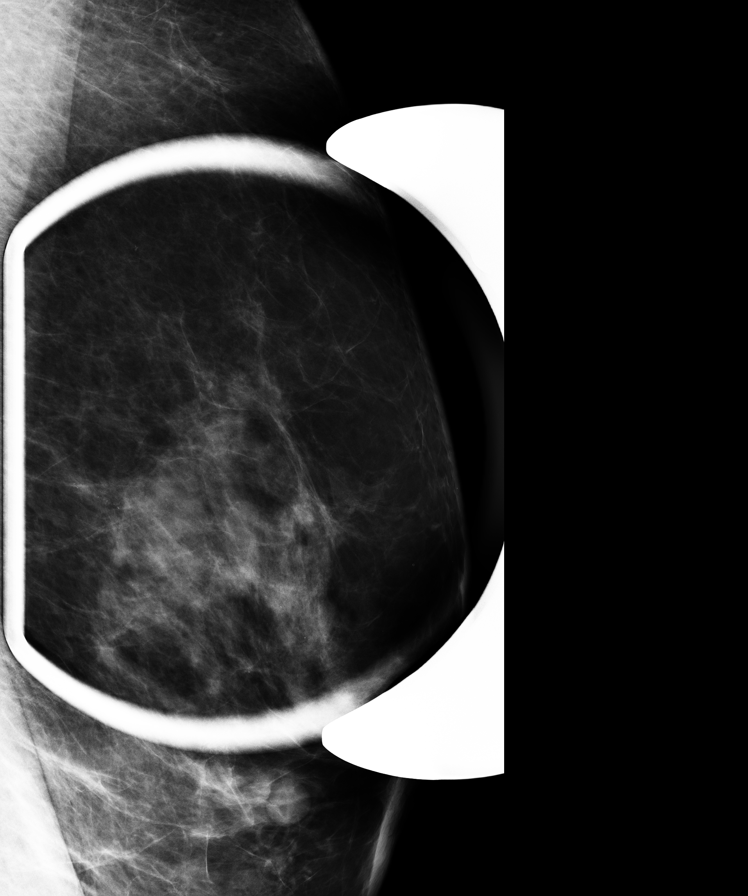

[L MLO]
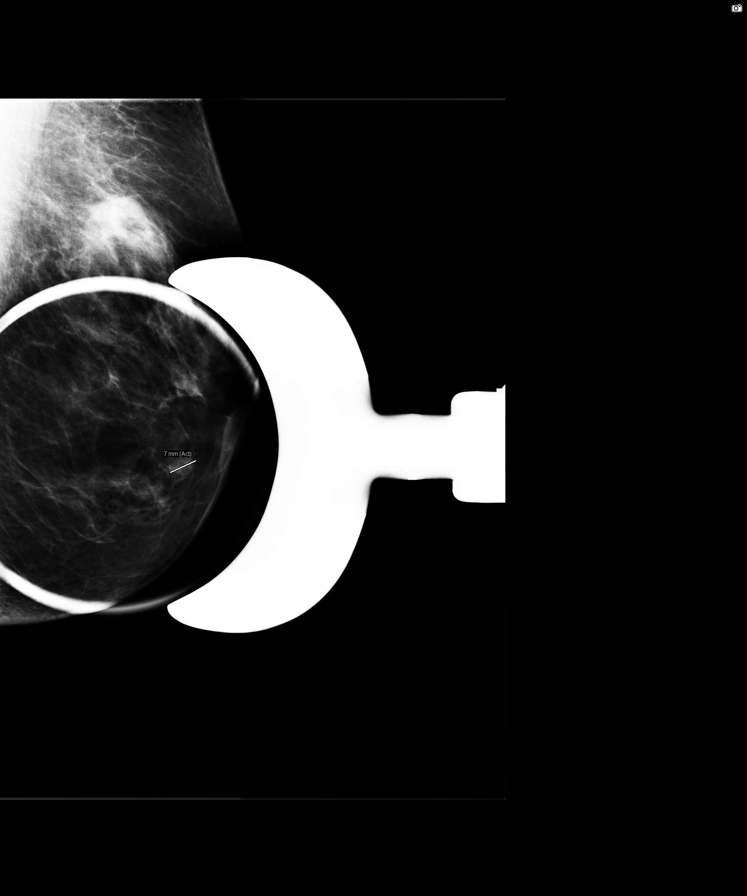

[L ML]
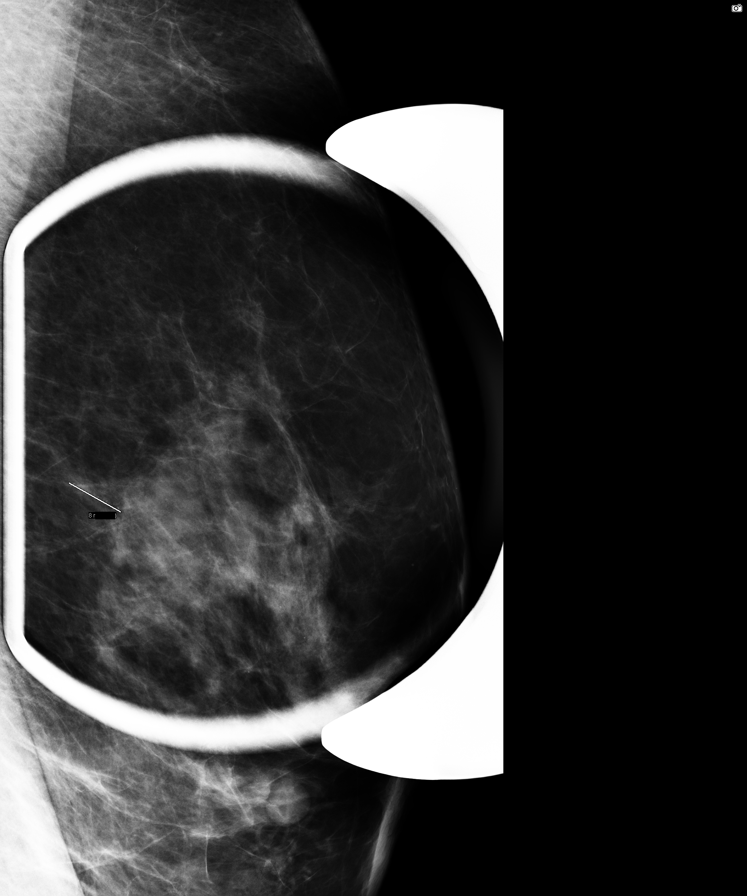

[L CC (2 of 2)]
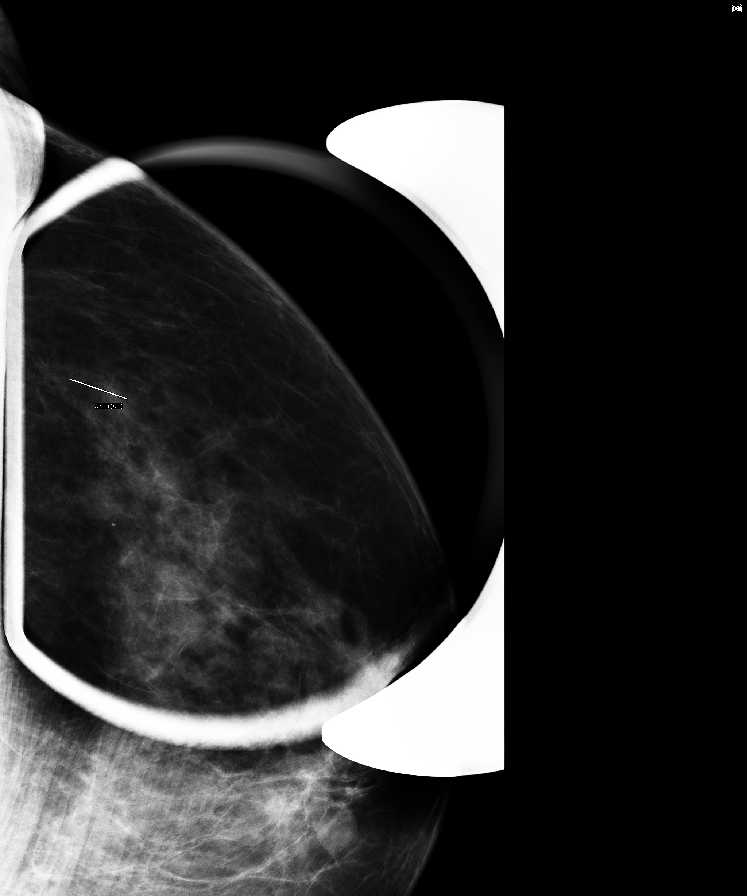

[7 of 7 positions shown; findings below may reference images not displayed]

ACR Breast Density Category b: There are scattered areas of
fibroglandular density.
FINDINGS: Spot compression views of the lower inner subareolar region of the
left breast was performed. There is a well-circumscribed 7 mm
nodule.

Magnification views of the upper-outer quadrant of the left breast
show developing 8 mm area of calcifications that very in size and
shape following a ductal pattern. There is no associated mass.

On physical exam I do not palpate a mass in the left breast.

Ultrasound is performed, showing there is a simple cyst in the left
breast at 9 o'clock in a subareolar location the measuring 5 x 3 x 7
mm.
IMPRESSION: Suspicious calcifications in the upper-outer quadrant of the left
breast.

RECOMMENDATION:
Stereotactic biopsy of the left breast is recommended. This will be
scheduled at the patient's convenience. The findings were discussed
with the patient and her daughter.

I have discussed the findings and recommendations with the patient.
Results were also provided in writing at the conclusion of the
visit. If applicable, a reminder letter will be sent to the patient
regarding the next appointment.

BI-RADS CATEGORY  4: Suspicious abnormality - biopsy should be
considered.

## 2015-11-19 ENCOUNTER — Ambulatory Visit: Payer: Commercial Managed Care - HMO | Admitting: Family Medicine

## 2015-12-01 ENCOUNTER — Ambulatory Visit (INDEPENDENT_AMBULATORY_CARE_PROVIDER_SITE_OTHER): Payer: PPO | Admitting: Family Medicine

## 2015-12-01 ENCOUNTER — Encounter: Payer: Self-pay | Admitting: Family Medicine

## 2015-12-01 VITALS — BP 132/60 | HR 74 | Temp 97.0°F | Resp 18 | Wt 131.0 lb

## 2015-12-01 DIAGNOSIS — L709 Acne, unspecified: Secondary | ICD-10-CM | POA: Diagnosis not present

## 2015-12-01 DIAGNOSIS — I10 Essential (primary) hypertension: Secondary | ICD-10-CM

## 2015-12-01 DIAGNOSIS — C4490 Unspecified malignant neoplasm of skin, unspecified: Secondary | ICD-10-CM | POA: Diagnosis not present

## 2015-12-01 DIAGNOSIS — G629 Polyneuropathy, unspecified: Secondary | ICD-10-CM | POA: Diagnosis not present

## 2015-12-01 DIAGNOSIS — E538 Deficiency of other specified B group vitamins: Secondary | ICD-10-CM

## 2015-12-01 DIAGNOSIS — R739 Hyperglycemia, unspecified: Secondary | ICD-10-CM

## 2015-12-01 LAB — POCT GLYCOSYLATED HEMOGLOBIN (HGB A1C): HEMOGLOBIN A1C: 5.6

## 2015-12-01 MED ORDER — DOXYCYCLINE HYCLATE 100 MG PO TABS: 100.0000 mg | ORAL_TABLET | Freq: Two times a day (BID) | ORAL | Status: DC

## 2015-12-01 NOTE — Progress Notes (Signed)
Patient ID: Felicia Weaver, female   DOB: 05/05/1925, 82 y.o.   MRN: [id number]    Subjective:  HPI  Pre- Diabetes, Follow-up:   Lab Results  Component Value Date   HGBA1C 6.2* 07/22/2015   HGBA1C 5.7 12/28/2014    Last seen for diabetes 4 months ago.  Management since then includes none. She reports good compliance with treatment. She is not having side effects.  Current symptoms include none. Home blood sugar records: Nott being checked.  Episodes of hypoglycemia? no   Current Insulin Regimen: n/a Most Recent Eye Exam: within the last year Weight trend: stable  Pertinent Labs:    Component Value Date/Time   CHOL 240* 06/04/2014   TRIG 110 06/04/2014   HDL 68 06/04/2014   LDLCALC 150 06/04/2014   CREATININE 1.49* 07/22/2015 1600   CREATININE 1.6* 06/04/2014    Wt Readings from Last 3 Encounters:  12/01/15 131 lb (59.421 kg)  07/22/15 128 lb (58.06 kg)  12/18/14 138 lb (62.596 kg)   ------------------------------------------------------------------------   Hypertension, follow-up:  BP Readings from Last 3 Encounters:  12/01/15 132/60  07/22/15 138/60  12/18/14 122/50    She was last seen for hypertension 4 months ago.  BP at that visit was 138/60. Management since that visit includes none. She reports good compliance with treatment. She is not having side effects.  She is not exercising. She is not adherent to low salt diet.   Outside blood pressures are not being checked. She is experiencing none.  Patient denies chest pain, chest pressure/discomfort, claudication, dyspnea, exertional chest pressure/discomfort, fatigue, irregular heart beat, lower extremity edema, near-syncope and orthopnea.    Wt Readings from Last 3 Encounters:  12/01/15 131 lb (59.421 kg)  07/22/15 128 lb (58.06 kg)  12/18/14 138 lb (62.596 kg)   ------------------------------------------------------------------------ Pt reports that she is feeling well other than she has a pain  underneath her toes on both feet when she walks.     Prior to Admission medications   Medication Sig Start Date End Date Taking? Authorizing Provider  aspirin 81 MG tablet Take by mouth.   Yes Historical Provider, MD  b complex vitamins capsule Take by mouth.   Yes Historical Provider, MD  Calcium-Magnesium-Vitamin D (CALCIUM 500 PO) Take by mouth daily.   Yes Historical Provider, MD  fluocinonide (LIDEX) 0.05 % external solution FLUOCINONIDE, 0.05% (External Solution)  1 (one) Solution Solution to scalp PRN for Eczema for 0 days  Quantity: 60;  Refills: 6   Ordered :04-Jun-2014  Dyer, MA, Anastasiya ;  Started 04-Jun-2014 Active 06/04/14  Yes Historical Provider, MD  hydrochlorothiazide (HYDRODIURIL) 25 MG tablet Take 25 mg by mouth daily.   Yes Historical Provider, MD  Multiple Vitamins tablet Take by mouth. 07/31/13  Yes Historical Provider, MD  timolol (BETIMOL) 0.5 % ophthalmic solution Place 1 drop into the left eye daily.   Yes Historical Provider, MD  Timolol Maleate PF 0.5 % SOLN Apply to eye. 07/31/13  Yes Historical Provider, MD    Patient Active Problem List   Diagnosis Date Noted  . Adult hypothyroidism 07/17/2015  . Dermatitis, eczematoid 07/17/2015  . BP (high blood pressure) 07/17/2015  . Blood glucose elevated 07/17/2015  . Combined fat and carbohydrate induced hyperlipemia 07/17/2015  . Osteopenia 07/17/2015  . Breast mass 03/08/2014  . Breast calcifications 10/10/2013  . Flat epithelial atypia of breast 08/17/2013  . Atheroma, skin 05/23/2009  . Glaucoma 05/03/2008    Past Medical History  Diagnosis Date  .  Glaucoma   . Fracture     right arm    Social History   Social History  . Marital Status: Widowed    Spouse Name: N/A  . Number of Children: N/A  . Years of Education: N/A   Occupational History  . Not on file.   Social History Main Topics  . Smoking status: Never Smoker   . Smokeless tobacco: Not on file  . Alcohol Use: No  .  Drug Use: No  . Sexual Activity: Not on file   Other Topics Concern  . Not on file   Social History Narrative    Allergies  Allergen Reactions  . Brimonidine Tartrate Other (See Comments)    Not Assessed Itching eyes  . Betagan  [Levobunolol Hcl] Other (See Comments)    Not Assessed  . Other Other (See Comments)    PROPINE. PROPINE -itching eyes  . Sulfa Antibiotics Nausea Only    Review of Systems  Constitutional: Negative.   HENT: Negative.   Eyes: Negative.   Respiratory: Negative.   Cardiovascular: Negative.   Gastrointestinal: Negative.   Genitourinary: Negative.   Musculoskeletal: Negative.        Pain under her toes  Skin: Negative.   Neurological: Negative.   Endo/Heme/Allergies: Negative.   Psychiatric/Behavioral: Negative.     Immunization History  Administered Date(s) Administered  . Influenza, High Dose Seasonal PF 07/22/2015  . Pneumococcal Conjugate-13 06/04/2014  . Pneumococcal Polysaccharide-23 07/23/2006  . Td 12/27/2008  . Zoster 04/26/2012   Objective:  BP 132/60 mmHg  Pulse 74  Temp(Src) 97 F (36.1 C) (Oral)  Resp 18  Wt 131 lb (59.421 kg)  Physical Exam  Constitutional: She is oriented to person, place, and time and well-developed, well-nourished, and in no distress.  HENT:  Head: Normocephalic and atraumatic.  Right Ear: External ear normal.  Left Ear: External ear normal.  Nose: Nose normal.  Eyes: Conjunctivae are normal.  Neck: Neck supple.  Cardiovascular: Normal rate, regular rhythm and normal heart sounds.   Pulmonary/Chest: Effort normal and breath sounds normal.  Abdominal: Soft.  Neurological: She is alert and oriented to person, place, and time.  Skin: Skin is warm and dry.  Very fair skin. She has some AK's of her face. She has a possible basal cell carcinoma versus SCC of her right forehead  Psychiatric: Mood, memory, affect and judgment normal.    Lab Results  Component Value Date   WBC 8.1 07/22/2015   HGB  13.3 06/04/2014   HCT 40.5 07/22/2015   PLT 242 07/22/2015   GLUCOSE 110* 07/22/2015   CHOL 240* 06/04/2014   TRIG 110 06/04/2014   HDL 68 06/04/2014   LDLCALC 150 06/04/2014   TSH 1.520 07/22/2015   HGBA1C 6.2* 07/22/2015    CMP     Component Value Date/Time   NA 140 07/22/2015 1600   K 4.2 07/22/2015 1600   CL 97 07/22/2015 1600   CO2 29 07/22/2015 1600   GLUCOSE 110* 07/22/2015 1600   BUN 20 07/22/2015 1600   CREATININE 1.49* 07/22/2015 1600   CREATININE 1.6* 06/04/2014   CALCIUM 10.4* 07/22/2015 1600   PROT 6.6 07/22/2015 1600   ALBUMIN 4.0 07/22/2015 1600   AST 20 07/22/2015 1600   ALT 12 07/22/2015 1600   ALKPHOS 76 07/22/2015 1600   BILITOT 0.3 07/22/2015 1600   GFRNONAA 31* 07/22/2015 1600   GFRAA 36* 07/22/2015 1600    Assessment and Plan :  1. Blood glucose elevated/prediabetes  -  POCT HgB A1C 5.6 today  2. Essential hypertension  - TSH  3. Neuropathy (HCC)  - Vitamin B12 - TSH  4. Vitamin B 12 deficiency  - Vitamin B12  5. Skin cancer  - Ambulatory referral to Dermatology  6. Acne, unspecified acne type Left side of face. Follow-up with Dr. Nehemiah Massed - doxycycline (VIBRA-TABS) 100 MG tablet; Take 1 tablet (100 mg total) by mouth 2 (two) times daily.  Dispense: 20 tablet; Refill: 0 I have done the exam and reviewed the above chart and it is accurate to the best of my knowledge.   Miguel Aschoff MD [facility] 12/01/2015 3:27 PM

## 2015-12-02 LAB — TSH: TSH: 1.72 u[IU]/mL (ref 0.450–4.500)

## 2015-12-02 LAB — VITAMIN B12: VITAMIN B 12: 670 pg/mL (ref 211–946)

## 2015-12-03 ENCOUNTER — Ambulatory Visit (INDEPENDENT_AMBULATORY_CARE_PROVIDER_SITE_OTHER): Payer: PPO

## 2015-12-03 DIAGNOSIS — E538 Deficiency of other specified B group vitamins: Secondary | ICD-10-CM | POA: Diagnosis not present

## 2015-12-03 MED ORDER — CYANOCOBALAMIN 1000 MCG/ML IJ SOLN
1000.0000 ug | Freq: Once | INTRAMUSCULAR | Status: AC
  Administered 2015-12-03: 1000 ug via INTRAMUSCULAR

## 2015-12-10 ENCOUNTER — Ambulatory Visit (INDEPENDENT_AMBULATORY_CARE_PROVIDER_SITE_OTHER): Payer: PPO

## 2015-12-10 DIAGNOSIS — E538 Deficiency of other specified B group vitamins: Secondary | ICD-10-CM | POA: Diagnosis not present

## 2015-12-10 MED ORDER — CYANOCOBALAMIN 1000 MCG/ML IJ SOLN
1000.0000 ug | Freq: Once | INTRAMUSCULAR | Status: AC
  Administered 2015-12-10: 1000 ug via INTRAMUSCULAR

## 2015-12-17 ENCOUNTER — Ambulatory Visit (INDEPENDENT_AMBULATORY_CARE_PROVIDER_SITE_OTHER): Payer: PPO

## 2015-12-17 DIAGNOSIS — E538 Deficiency of other specified B group vitamins: Secondary | ICD-10-CM | POA: Diagnosis not present

## 2015-12-17 MED ORDER — CYANOCOBALAMIN 1000 MCG/ML IJ SOLN
1000.0000 ug | Freq: Once | INTRAMUSCULAR | Status: AC
  Administered 2015-12-17: 1000 ug via INTRAMUSCULAR

## 2015-12-24 ENCOUNTER — Ambulatory Visit (INDEPENDENT_AMBULATORY_CARE_PROVIDER_SITE_OTHER): Payer: PPO | Admitting: *Deleted

## 2015-12-24 DIAGNOSIS — E538 Deficiency of other specified B group vitamins: Secondary | ICD-10-CM

## 2015-12-24 MED ORDER — CYANOCOBALAMIN 1000 MCG/ML IJ SOLN
1000.0000 ug | Freq: Once | INTRAMUSCULAR | Status: AC
Start: 2015-12-24 — End: 2015-12-24
  Administered 2015-12-24: 1000 ug via INTRAMUSCULAR

## 2015-12-31 DIAGNOSIS — L578 Other skin changes due to chronic exposure to nonionizing radiation: Secondary | ICD-10-CM | POA: Diagnosis not present

## 2015-12-31 DIAGNOSIS — L812 Freckles: Secondary | ICD-10-CM | POA: Diagnosis not present

## 2015-12-31 DIAGNOSIS — L570 Actinic keratosis: Secondary | ICD-10-CM | POA: Diagnosis not present

## 2015-12-31 DIAGNOSIS — L821 Other seborrheic keratosis: Secondary | ICD-10-CM | POA: Diagnosis not present

## 2015-12-31 DIAGNOSIS — L718 Other rosacea: Secondary | ICD-10-CM | POA: Diagnosis not present

## 2015-12-31 DIAGNOSIS — Z85828 Personal history of other malignant neoplasm of skin: Secondary | ICD-10-CM | POA: Diagnosis not present

## 2015-12-31 DIAGNOSIS — L720 Epidermal cyst: Secondary | ICD-10-CM | POA: Diagnosis not present

## 2016-01-16 ENCOUNTER — Ambulatory Visit (INDEPENDENT_AMBULATORY_CARE_PROVIDER_SITE_OTHER): Payer: PPO

## 2016-01-16 DIAGNOSIS — E538 Deficiency of other specified B group vitamins: Secondary | ICD-10-CM

## 2016-01-16 MED ORDER — CYANOCOBALAMIN 1000 MCG/ML IJ SOLN
1000.0000 ug | Freq: Once | INTRAMUSCULAR | Status: AC
  Administered 2016-01-16: 1000 ug via INTRAMUSCULAR

## 2016-01-22 DIAGNOSIS — H401132 Primary open-angle glaucoma, bilateral, moderate stage: Secondary | ICD-10-CM | POA: Diagnosis not present

## 2016-01-22 DIAGNOSIS — M19019 Primary osteoarthritis, unspecified shoulder: Secondary | ICD-10-CM | POA: Diagnosis not present

## 2016-01-22 DIAGNOSIS — M19011 Primary osteoarthritis, right shoulder: Secondary | ICD-10-CM | POA: Diagnosis not present

## 2016-01-22 DIAGNOSIS — M7541 Impingement syndrome of right shoulder: Secondary | ICD-10-CM | POA: Diagnosis not present

## 2016-01-29 ENCOUNTER — Encounter: Payer: Self-pay | Admitting: General Surgery

## 2016-01-29 ENCOUNTER — Ambulatory Visit (INDEPENDENT_AMBULATORY_CARE_PROVIDER_SITE_OTHER): Payer: PPO | Admitting: General Surgery

## 2016-01-29 VITALS — BP 130/78 | HR 70 | Resp 14 | Ht 64.0 in | Wt 128.0 lb

## 2016-01-29 DIAGNOSIS — N644 Mastodynia: Secondary | ICD-10-CM

## 2016-01-29 NOTE — Patient Instructions (Signed)
Call and let us know if the injection was helpful. Continue self breast exams. Call office for any new breast issues or concerns.

## 2016-01-29 NOTE — Progress Notes (Signed)
Patient ID: Felicia Weaver, female   DOB: February 23, 1925, 82 y.o.   MRN: [id number]  Chief Complaint  Patient presents with  . Other    left breast pain    HPI EDISON BRENTON is a 82 y.o. female here for assessment of left breast pain. She had a left breast excision in 08/28/13. Her last left breast mammogram was 02/19/14.  She reports that she first noticed this pain about six months ago. She states the pain is a throbbing pain, last for a couple of minutes. The patient also reports discomfort at the epigastric port site from a previous cholecystectomy. She has had 3 days of lower extremity discomfort, usually at night, not associated with discomfort during ambulation.  She is accompanied by her daughter.    HPI  Past Medical History  Diagnosis Date  . Glaucoma   . Fracture     right arm    Past Surgical History  Procedure Laterality Date  . Cholecystectomy  Nov 2012  . Breast biopsy Left 07-30-13    stereotatic  . Breast biopsy Left 08/28/13    benign/Flat epithelial atypia without severe dysplasia or malignancy. Surgical margins free of atypia  . Hand surgery    . Mohs surgery      Family History  Problem Relation Age of Onset  . Hypertension Sister   . Dementia Sister   . Dementia Mother   . Stroke Father   . Heart attack Brother   . Cancer Brother   . Stroke Brother   . Heart disease Sister     Social History Social History  Substance Use Topics  . Smoking status: Never Smoker   . Smokeless tobacco: None  . Alcohol Use: No    Allergies  Allergen Reactions  . Brimonidine Tartrate Other (See Comments)    Not Assessed Itching eyes  . Betagan  [Levobunolol Hcl] Other (See Comments)    Not Assessed  . Other Other (See Comments)    PROPINE. PROPINE -itching eyes  . Sulfa Antibiotics Nausea Only    Current Outpatient Prescriptions  Medication Sig Dispense Refill  . aspirin 81 MG tablet Take by mouth.    . B Complex Vitamins (B-COMPLEX/B-12) LIQD Place under the  tongue.    . Calcium-Magnesium-Vitamin D (CALCIUM 500 PO) Take by mouth daily.    . fluocinonide (LIDEX) 0.05 % external solution FLUOCINONIDE, 0.05% (External Solution)  1 (one) Solution Solution to scalp PRN for Eczema for 0 days  Quantity: 60;  Refills: 6   Ordered :04-Jun-2014  Celene Kras, MA, Anastasiya ;  Started 04-Jun-2014 Active    . hydrochlorothiazide (HYDRODIURIL) 25 MG tablet Take 25 mg by mouth daily.    . Multiple Vitamins tablet Take by mouth.    . timolol (BETIMOL) 0.5 % ophthalmic solution Place 1 drop into the left eye daily.     No current facility-administered medications for this visit.    Review of Systems Review of Systems  Constitutional: Negative.   Respiratory: Negative.   Cardiovascular: Negative.     Blood pressure 130/78, pulse 70, resp. rate 14, height 5\' 4"  (1.626 m), weight 128 lb (58.06 kg).  Physical Exam Physical Exam  Constitutional: She is oriented to person, place, and time. She appears well-developed and well-nourished.  Eyes: Conjunctivae are normal. No scleral icterus.  Neck: Neck supple.  Cardiovascular: Normal rate, regular rhythm and normal heart sounds.   Pulses:      Dorsalis pedis pulses are 1+ on the right side, and  1+ on the left side.       Posterior tibial pulses are 1+ on the right side, and 1+ on the left side.  No peripheral edema identified.  Pulmonary/Chest: Effort normal and breath sounds normal. Right breast exhibits no inverted nipple, no mass, no nipple discharge, no skin change and no tenderness. Left breast exhibits no inverted nipple, no mass, no nipple discharge, no skin change and no tenderness.  Left breast thickening along the scar at 2 o'clock.   Abdominal: Soft. Normal appearance and bowel sounds are normal. There is no hepatomegaly.  Thickening at the epigastric site.   Lymphadenopathy:    She has no cervical adenopathy.  Neurological: She is alert and oriented to person, place, and time.  Skin: Skin is  warm and dry.    Data Reviewed  LEFT BREAST BIOPSY:  - FLAT EPITHELIAL ATYPIA (COLUMNAR CELL CHANGE WITH ATYPIA).  - COLUMNAR CELL CHANGE WITHOUT ATYPIA AND ASSOCIATED FOCAL  MICROCALCIFICATIONS.  - FOCALLY NODULAR HYALINIZED STROMA AND CHANGES CONSISTENT WITH  PREVIOUS SURGICAL PROCEDURE.  - THE MARGINS OF EXCISION ARE NEGATIVE FOR ATYPIA.  - NEGATIVE FOR MALIGNANCY.  XDB/08/29/2013   Assessment    Focal scarring with associated discomfort.  No evidence of lower extremity peripheral vascular disease or significant venous insufficiency.    Plan    An option for management would be the injection of dexamethasone in the area of irritation. This was amenable to the patient. 1 mL of 0.5% Marcaine, plain with 4 mg of dexamethasone was injected into the scar. Well tolerated.  The patient has been instructed that it may take 2-3 days for the dexamethasone to have maximal effect on the local discomfort. She's been asked to give a phone follow-up in 2 weeks with her progress.    PCP: Miguel Aschoff  This information has been scribed by Gaspar Cola CMA.   Robert Bellow 01/31/2016, 10:54 AM

## 2016-01-31 DIAGNOSIS — N644 Mastodynia: Secondary | ICD-10-CM | POA: Insufficient documentation

## 2016-02-02 MED ORDER — DEXAMETHASONE SODIUM PHOSPHATE 4 MG/ML IJ SOLN: 4.0000 mg | Freq: Once | INTRAMUSCULAR | Status: DC

## 2016-02-02 NOTE — Addendum Note (Signed)
Addended by: Georgana Curio on: 02/02/2016 09:01 AM   Modules accepted: Orders

## 2016-02-05 ENCOUNTER — Other Ambulatory Visit: Payer: Self-pay | Admitting: Family Medicine

## 2016-02-13 ENCOUNTER — Ambulatory Visit (INDEPENDENT_AMBULATORY_CARE_PROVIDER_SITE_OTHER): Payer: PPO | Admitting: Emergency Medicine

## 2016-02-13 DIAGNOSIS — E538 Deficiency of other specified B group vitamins: Secondary | ICD-10-CM | POA: Diagnosis not present

## 2016-02-13 MED ORDER — CYANOCOBALAMIN 1000 MCG/ML IJ SOLN
1000.0000 ug | Freq: Once | INTRAMUSCULAR | Status: AC
  Administered 2016-02-13: 1000 ug via INTRAMUSCULAR

## 2016-03-10 ENCOUNTER — Ambulatory Visit (INDEPENDENT_AMBULATORY_CARE_PROVIDER_SITE_OTHER): Payer: PPO

## 2016-03-10 DIAGNOSIS — E538 Deficiency of other specified B group vitamins: Secondary | ICD-10-CM

## 2016-03-10 DIAGNOSIS — H18422 Band keratopathy, left eye: Secondary | ICD-10-CM | POA: Diagnosis not present

## 2016-03-10 MED ORDER — CYANOCOBALAMIN 1000 MCG/ML IJ SOLN
1000.0000 ug | Freq: Once | INTRAMUSCULAR | Status: AC
  Administered 2016-03-10: 1000 ug via INTRAMUSCULAR

## 2016-03-12 DIAGNOSIS — H18422 Band keratopathy, left eye: Secondary | ICD-10-CM | POA: Diagnosis not present

## 2016-03-17 DIAGNOSIS — H209 Unspecified iridocyclitis: Secondary | ICD-10-CM | POA: Diagnosis not present

## 2016-03-30 DIAGNOSIS — H18422 Band keratopathy, left eye: Secondary | ICD-10-CM | POA: Diagnosis not present

## 2016-04-02 DIAGNOSIS — H18422 Band keratopathy, left eye: Secondary | ICD-10-CM | POA: Diagnosis not present

## 2016-04-07 ENCOUNTER — Ambulatory Visit (INDEPENDENT_AMBULATORY_CARE_PROVIDER_SITE_OTHER): Payer: PPO

## 2016-04-07 DIAGNOSIS — E538 Deficiency of other specified B group vitamins: Secondary | ICD-10-CM

## 2016-04-07 DIAGNOSIS — H18422 Band keratopathy, left eye: Secondary | ICD-10-CM | POA: Diagnosis not present

## 2016-04-07 MED ORDER — CYANOCOBALAMIN 1000 MCG/ML IJ SOLN
1000.0000 ug | Freq: Once | INTRAMUSCULAR | Status: AC
  Administered 2016-04-07: 1000 ug via INTRAMUSCULAR

## 2016-04-09 DIAGNOSIS — H18899 Other specified disorders of cornea, unspecified eye: Secondary | ICD-10-CM | POA: Diagnosis not present

## 2016-04-14 DIAGNOSIS — H1830 Unspecified corneal membrane change: Secondary | ICD-10-CM | POA: Diagnosis not present

## 2016-04-21 DIAGNOSIS — H1830 Unspecified corneal membrane change: Secondary | ICD-10-CM | POA: Diagnosis not present

## 2016-04-28 DIAGNOSIS — H1830 Unspecified corneal membrane change: Secondary | ICD-10-CM | POA: Diagnosis not present

## 2016-05-04 ENCOUNTER — Ambulatory Visit (INDEPENDENT_AMBULATORY_CARE_PROVIDER_SITE_OTHER): Payer: PPO | Admitting: Family Medicine

## 2016-05-04 ENCOUNTER — Encounter: Payer: Self-pay | Admitting: Family Medicine

## 2016-05-04 VITALS — BP 144/68 | HR 72 | Temp 98.6°F | Resp 14 | Wt 135.0 lb

## 2016-05-04 DIAGNOSIS — R5383 Other fatigue: Secondary | ICD-10-CM | POA: Diagnosis not present

## 2016-05-04 DIAGNOSIS — H1830 Unspecified corneal membrane change: Secondary | ICD-10-CM | POA: Diagnosis not present

## 2016-05-04 DIAGNOSIS — I10 Essential (primary) hypertension: Secondary | ICD-10-CM

## 2016-05-04 DIAGNOSIS — E538 Deficiency of other specified B group vitamins: Secondary | ICD-10-CM

## 2016-05-04 MED ORDER — CYANOCOBALAMIN 1000 MCG/ML IJ SOLN
1000.0000 ug | Freq: Once | INTRAMUSCULAR | Status: AC
  Administered 2016-05-04: 1000 ug via INTRAMUSCULAR

## 2016-05-04 NOTE — Progress Notes (Signed)
Subjective:  HPI  Patient is here for 6 months follow up. Hypertension: patient does not check her b/p. BP Readings from Last 3 Encounters:  05/04/16 (!) 158/62  01/29/16 130/78  12/01/15 132/60   Vitamin B12 deficiency: B12 level checked on 12/01/15 it was normal but with patient having symptoms at that time she was advised to switch to B12 injections and stop B12 OTC. Patient states pain/numbness in toes is about the same and energy level about the same since getting injections.  Prior to Admission medications   Medication Sig Start Date End Date Taking? Authorizing Provider  aspirin 81 MG tablet Take by mouth.    Historical Provider, MD  B Complex Vitamins (B-COMPLEX/B-12) LIQD Place under the tongue.    Historical Provider, MD  Calcium-Magnesium-Vitamin D (CALCIUM 500 PO) Take by mouth daily.    Historical Provider, MD  fluocinonide (LIDEX) 0.05 % external solution FLUOCINONIDE, 0.05% (External Solution)  1 (one) Solution Solution to scalp PRN for Eczema for 0 days  Quantity: 60;  Refills: 6   Ordered :04-Jun-2014  Mayking, MA, Anastasiya ;  Started 04-Jun-2014 Active 06/04/14   Historical Provider, MD  hydrochlorothiazide (HYDRODIURIL) 25 MG tablet TAKE ONE (1) TABLET EACH DAY 02/05/16   Jerrol Banana., MD  Multiple Vitamins tablet Take by mouth. 07/31/13   Historical Provider, MD  timolol (BETIMOL) 0.5 % ophthalmic solution Place 1 drop into the left eye daily.    Historical Provider, MD    Patient Active Problem List   Diagnosis Date Noted  . Mastalgia in female 01/31/2016  . Adult hypothyroidism 07/17/2015  . Dermatitis, eczematoid 07/17/2015  . BP (high blood pressure) 07/17/2015  . Blood glucose elevated 07/17/2015  . Combined fat and carbohydrate induced hyperlipemia 07/17/2015  . Osteopenia 07/17/2015  . Breast mass 03/08/2014  . Breast calcifications 10/10/2013  . Flat epithelial atypia of breast 08/17/2013  . Atheroma, skin 05/23/2009  .  Glaucoma 05/03/2008    Past Medical History:  Diagnosis Date  . Fracture    right arm  . Glaucoma     Social History   Social History  . Marital status: Widowed    Spouse name: N/A  . Number of children: N/A  . Years of education: N/A   Occupational History  . Not on file.   Social History Main Topics  . Smoking status: Never Smoker  . Smokeless tobacco: Not on file  . Alcohol use No  . Drug use: No  . Sexual activity: Not on file   Other Topics Concern  . Not on file   Social History Narrative  . No narrative on file    Allergies  Allergen Reactions  . Betagan  [Levobunolol Hcl] Other (See Comments)    Not Assessed  . Brimonidine Tartrate Other (See Comments)    Not Assessed Itching eyes  . Other Other (See Comments)    PROPINE. PROPINE -itching eyes  . Sulfa Antibiotics Nausea Only    Review of Systems  Constitutional: Positive for malaise/fatigue.  Respiratory: Negative.   Cardiovascular: Negative.   Musculoskeletal: Negative.   Neurological: Positive for tingling.  Endo/Heme/Allergies: Negative.   Psychiatric/Behavioral: Negative.     Immunization History  Administered Date(s) Administered  . Influenza, High Dose Seasonal PF 07/22/2015  . Pneumococcal Conjugate-13 06/04/2014  . Pneumococcal Polysaccharide-23 07/23/2006  . Td 12/27/2008  . Zoster 04/26/2012   Objective:  BP (!) 158/62   Pulse 72   Temp 98.6 F (37 C)   Resp  14   Wt 135 lb (61.2 kg)   BMI 23.17 kg/m   Physical Exam  Constitutional: She is oriented to person, place, and time and well-developed, well-nourished, and in no distress.  HENT:  Head: Normocephalic and atraumatic.  Right Ear: External ear normal.  Left Ear: External ear normal.  Nose: Nose normal.  Eyes: Conjunctivae are normal. Pupils are equal, round, and reactive to light.  Neck: Normal range of motion. Neck supple. No thyromegaly present.  Cardiovascular: Normal rate, regular rhythm, normal heart sounds  and intact distal pulses.   No murmur heard. Pulmonary/Chest: Effort normal and breath sounds normal. No respiratory distress. She has no wheezes.  Abdominal: Soft.  Musculoskeletal: She exhibits no edema or tenderness.  Lymphadenopathy:    She has no cervical adenopathy.  Neurological: She is alert and oriented to person, place, and time.  Skin: Skin is warm and dry.  Psychiatric: Mood, memory, affect and judgment normal.    Lab Results  Component Value Date   WBC 8.1 07/22/2015   HGB 13.3 06/04/2014   HCT 40.5 07/22/2015   PLT 242 07/22/2015   GLUCOSE 110 (H) 07/22/2015   CHOL 240 (A) 06/04/2014   TRIG 110 06/04/2014   HDL 68 06/04/2014   LDLCALC 150 06/04/2014   TSH 1.720 12/01/2015   HGBA1C 5.6 12/01/2015    CMP     Component Value Date/Time   NA 140 07/22/2015 1600   K 4.2 07/22/2015 1600   CL 97 07/22/2015 1600   CO2 29 07/22/2015 1600   GLUCOSE 110 (H) 07/22/2015 1600   BUN 20 07/22/2015 1600   CREATININE 1.49 (H) 07/22/2015 1600   CALCIUM 10.4 (H) 07/22/2015 1600   PROT 6.6 07/22/2015 1600   ALBUMIN 4.0 07/22/2015 1600   AST 20 07/22/2015 1600   ALT 12 07/22/2015 1600   ALKPHOS 76 07/22/2015 1600   BILITOT 0.3 07/22/2015 1600   GFRNONAA 31 (L) 07/22/2015 1600   GFRAA 36 (L) 07/22/2015 1600    Assessment and Plan :  1. Vitamin B12 deficiency Patient does not feel any different. Will switch to OTC B12 after todays last injection. If symptoms get worse we can go back to the injections. Follow. - cyanocobalamin ((VITAMIN B-12)) injection 1,000 mcg; Inject 1 mL (1,000 mcg total) into the muscle once.  2. Essential hypertension Slightly elevated. Follow. No medication changes at this time. Re check in 4 months.  3. Other fatigue  Patient was seen and examined by Dr. Eulas Post and note was scribed by Theressa Millard, RMA.    Miguel Aschoff MD [facility] 05/04/2016 1:48 PM

## 2016-05-05 ENCOUNTER — Ambulatory Visit: Payer: Self-pay

## 2016-05-18 DIAGNOSIS — H1830 Unspecified corneal membrane change: Secondary | ICD-10-CM | POA: Diagnosis not present

## 2016-06-02 DIAGNOSIS — H18422 Band keratopathy, left eye: Secondary | ICD-10-CM | POA: Diagnosis not present

## 2016-06-21 DIAGNOSIS — H18422 Band keratopathy, left eye: Secondary | ICD-10-CM | POA: Diagnosis not present

## 2016-06-22 DIAGNOSIS — H1830 Unspecified corneal membrane change: Secondary | ICD-10-CM | POA: Diagnosis not present

## 2016-06-23 ENCOUNTER — Ambulatory Visit (INDEPENDENT_AMBULATORY_CARE_PROVIDER_SITE_OTHER): Payer: PPO | Admitting: Physician Assistant

## 2016-06-23 DIAGNOSIS — Z23 Encounter for immunization: Secondary | ICD-10-CM

## 2016-06-23 DIAGNOSIS — H1830 Unspecified corneal membrane change: Secondary | ICD-10-CM | POA: Diagnosis not present

## 2016-06-25 DIAGNOSIS — H1830 Unspecified corneal membrane change: Secondary | ICD-10-CM | POA: Diagnosis not present

## 2016-07-28 DIAGNOSIS — H18422 Band keratopathy, left eye: Secondary | ICD-10-CM | POA: Diagnosis not present

## 2016-09-01 ENCOUNTER — Ambulatory Visit (INDEPENDENT_AMBULATORY_CARE_PROVIDER_SITE_OTHER): Payer: PPO | Admitting: Family Medicine

## 2016-09-01 ENCOUNTER — Ambulatory Visit: Payer: PPO | Admitting: Family Medicine

## 2016-09-01 VITALS — BP 134/60 | HR 64 | Temp 98.1°F | Resp 16 | Wt 139.0 lb

## 2016-09-01 DIAGNOSIS — I10 Essential (primary) hypertension: Secondary | ICD-10-CM

## 2016-09-01 DIAGNOSIS — R739 Hyperglycemia, unspecified: Secondary | ICD-10-CM | POA: Diagnosis not present

## 2016-09-01 DIAGNOSIS — H18422 Band keratopathy, left eye: Secondary | ICD-10-CM | POA: Diagnosis not present

## 2016-09-01 DIAGNOSIS — E782 Mixed hyperlipidemia: Secondary | ICD-10-CM | POA: Diagnosis not present

## 2016-09-01 DIAGNOSIS — E538 Deficiency of other specified B group vitamins: Secondary | ICD-10-CM

## 2016-09-01 DIAGNOSIS — E039 Hypothyroidism, unspecified: Secondary | ICD-10-CM

## 2016-09-01 NOTE — Progress Notes (Signed)
Felicia Weaver  MRN: [id number] DOB: [birth year]  Subjective:  HPI   The patient is an 82 year old female who presents for follow up of her hypertension.  She was last seen on 05/04/16 and her blood pressure at that time was 144/68.  She is currently on HCTZ 25 mg for her blood pressure.   The patient is also due to have labs checked.  Her last labs were; 12/01/15 TSH, B12 and A1C.  07/22/15 CBC, Met C and 06/04/14 Lipids.  She is not currently being treated for cholesterol or diabetes.  Patient Active Problem List   Diagnosis Date Noted  . Mastalgia in female 01/31/2016  . Adult hypothyroidism 07/17/2015  . Dermatitis, eczematoid 07/17/2015  . BP (high blood pressure) 07/17/2015  . Blood glucose elevated 07/17/2015  . Combined fat and carbohydrate induced hyperlipemia 07/17/2015  . Osteopenia 07/17/2015  . Breast mass 03/08/2014  . Breast calcifications 10/10/2013  . Flat epithelial atypia of breast 08/17/2013  . Atheroma, skin 05/23/2009  . Glaucoma 05/03/2008    Past Medical History:  Diagnosis Date  . Fracture    right arm  . Glaucoma     Social History   Social History  . Marital status: Widowed    Spouse name: N/A  . Number of children: N/A  . Years of education: N/A   Occupational History  . Not on file.   Social History Main Topics  . Smoking status: Never Smoker  . Smokeless tobacco: Not on file  . Alcohol use No  . Drug use: No  . Sexual activity: Not on file   Other Topics Concern  . Not on file   Social History Narrative  . No narrative on file    Outpatient Encounter Prescriptions as of 09/01/2016  Medication Sig Note  . aspirin 81 MG tablet Take by mouth. 07/17/2015: Received from: [facility]  . Calcium-Magnesium-Vitamin D (CALCIUM 500 PO) Take by mouth daily.   . fluocinonide (LIDEX) 0.05 % external solution FLUOCINONIDE, 0.05% (External Solution)  1 (one) Solution Solution to scalp PRN for Eczema for 0 days  Quantity: 60;   Refills: 6   Ordered :04-Jun-2014  [place], [place], Anastasiya ;  Started 04-Jun-2014 Active 07/17/2015: Received from: [facility]  . hydrochlorothiazide (HYDRODIURIL) 25 MG tablet TAKE ONE (1) TABLET EACH DAY   . moxifloxacin (VIGAMOX) 0.5 % ophthalmic solution 1 drop 3 (three) times daily.   . Multiple Vitamins tablet Take by mouth. 07/17/2015: Received from: [facility]  . vitamin B-12 (CYANOCOBALAMIN) 1000 MCG tablet Take 1,000 mcg by mouth daily.   . [DISCONTINUED] timolol (BETIMOL) 0.5 % ophthalmic solution Place 1 drop into the left eye daily.    Facility-Administered Encounter Medications as of 09/01/2016  Medication  . dexamethasone (DECADRON) injection 4 mg    Allergies  Allergen Reactions  . Betagan  [Levobunolol Hcl] Other (See Comments)    Not Assessed  . Brimonidine Tartrate Other (See Comments)    Not Assessed Itching eyes  . Other Other (See Comments)    PROPINE. PROPINE -itching eyes  . Sulfa Antibiotics Nausea Only    Review of Systems  Constitutional: Negative for fever and malaise/fatigue.  Eyes: Negative.        Significant visual loss due to glaucoma.  Respiratory: Negative for cough, shortness of breath and wheezing.   Cardiovascular: Negative for chest pain, palpitations, orthopnea, claudication and leg swelling.  Gastrointestinal: Negative.   Neurological: Negative for weakness.  Endo/Heme/Allergies: Negative.  Psychiatric/Behavioral: Negative.     Objective:  BP 134/60 (BP Location: Right Arm, Patient Position: Sitting, Cuff Size: Normal)   Pulse 64   Temp 98.1 F (36.7 C) (Oral)   Resp 16   Wt 139 lb (63 kg)   BMI 23.86 kg/m   Physical Exam  Constitutional: She is oriented to person, place, and time and well-developed, well-nourished, and in no distress.  HENT:  Head: Normocephalic and atraumatic.  Right Ear: External ear normal.  Left Ear: External ear normal.  Nose: Nose normal.  Eyes:  Conjunctivae are normal.  Neck: No thyromegaly present.  Cardiovascular: Normal rate, regular rhythm and normal heart sounds.   Pulmonary/Chest: Effort normal and breath sounds normal.  Abdominal: Soft.  Neurological: She is alert and oriented to person, place, and time. Gait normal.  Skin: Skin is warm and dry.  Fair skin.  Psychiatric: Mood, memory, affect and judgment normal.    Assessment and Plan :  Hypertension Controlled. 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 Development worker, community has been used and  any errors in dictation or transcription are unintentional. Miguel Aschoff M.D. [facility]

## 2016-09-03 DIAGNOSIS — E039 Hypothyroidism, unspecified: Secondary | ICD-10-CM | POA: Diagnosis not present

## 2016-09-03 DIAGNOSIS — E538 Deficiency of other specified B group vitamins: Secondary | ICD-10-CM | POA: Diagnosis not present

## 2016-09-03 DIAGNOSIS — R739 Hyperglycemia, unspecified: Secondary | ICD-10-CM | POA: Diagnosis not present

## 2016-09-03 DIAGNOSIS — E782 Mixed hyperlipidemia: Secondary | ICD-10-CM | POA: Diagnosis not present

## 2016-09-03 DIAGNOSIS — I10 Essential (primary) hypertension: Secondary | ICD-10-CM | POA: Diagnosis not present

## 2016-09-03 DIAGNOSIS — H1830 Unspecified corneal membrane change: Secondary | ICD-10-CM | POA: Diagnosis not present

## 2016-09-04 LAB — COMPREHENSIVE METABOLIC PANEL
ALT: 15 IU/L (ref 0–32)
AST: 16 IU/L (ref 0–40)
Albumin/Globulin Ratio: 1.7 (ref 1.2–2.2)
Albumin: 4.1 g/dL (ref 3.5–4.7)
Alkaline Phosphatase: 65 IU/L (ref 39–117)
BILIRUBIN TOTAL: 0.4 mg/dL (ref 0.0–1.2)
BUN/Creatinine Ratio: 16 (ref 12–28)
BUN: 26 mg/dL (ref 8–27)
CO2: 29 mmol/L (ref 18–29)
Calcium: 10 mg/dL (ref 8.7–10.3)
Chloride: 100 mmol/L (ref 96–106)
Creatinine, Ser: 1.66 mg/dL — ABNORMAL HIGH (ref 0.57–1.00)
GFR calc Af Amer: 31 mL/min/{1.73_m2} — ABNORMAL LOW (ref >59)
GFR calc non Af Amer: 27 mL/min/{1.73_m2} — ABNORMAL LOW (ref >59)
GLOBULIN, TOTAL: 2.4 g/dL (ref 1.5–4.5)
Glucose: 102 mg/dL — ABNORMAL HIGH (ref 65–99)
POTASSIUM: 4.3 mmol/L (ref 3.5–5.2)
SODIUM: 143 mmol/L (ref 134–144)
Total Protein: 6.5 g/dL (ref 6.0–8.5)

## 2016-09-04 LAB — CBC WITH DIFFERENTIAL/PLATELET
BASOS: 1 %
Basophils Absolute: 0 10*3/uL (ref 0.0–0.2)
EOS (ABSOLUTE): 0.1 10*3/uL (ref 0.0–0.4)
EOS: 3 %
HEMATOCRIT: 38.5 % (ref 34.0–46.6)
Hemoglobin: 13.2 g/dL (ref 11.1–15.9)
Immature Grans (Abs): 0 10*3/uL (ref 0.0–0.1)
Immature Granulocytes: 0 %
LYMPHS ABS: 1.4 10*3/uL (ref 0.7–3.1)
Lymphs: 31 %
MCH: 32 pg (ref 26.6–33.0)
MCHC: 34.3 g/dL (ref 31.5–35.7)
MCV: 93 fL (ref 79–97)
MONOS ABS: 0.4 10*3/uL (ref 0.1–0.9)
Monocytes: 9 %
Neutrophils Absolute: 2.7 10*3/uL (ref 1.4–7.0)
Neutrophils: 56 %
Platelets: 185 10*3/uL (ref 150–379)
RBC: 4.12 x10E6/uL (ref 3.77–5.28)
RDW: 13.2 % (ref 12.3–15.4)
WBC: 4.7 10*3/uL (ref 3.4–10.8)

## 2016-09-04 LAB — TSH: TSH: 2.87 u[IU]/mL (ref 0.450–4.500)

## 2016-09-04 LAB — B12 AND FOLATE PANEL: Folate: >20 ng/mL (ref >3.0)

## 2016-09-04 LAB — HEMOGLOBIN A1C
ESTIMATED AVERAGE GLUCOSE: 117 mg/dL
HEMOGLOBIN A1C: 5.7 % — AB (ref 4.8–5.6)

## 2016-09-04 LAB — LIPID PANEL WITH LDL/HDL RATIO
Cholesterol, Total: 227 mg/dL — ABNORMAL HIGH (ref 100–199)
HDL: 72 mg/dL (ref >39)
LDL Calculated: 137 mg/dL — ABNORMAL HIGH (ref 0–99)
LDl/HDL Ratio: 1.9 ratio units (ref 0.0–3.2)
Triglycerides: 91 mg/dL (ref 0–149)
VLDL CHOLESTEROL CAL: 18 mg/dL (ref 5–40)

## 2016-09-07 NOTE — Progress Notes (Signed)
Advised  ED 

## 2016-09-08 DIAGNOSIS — H1830 Unspecified corneal membrane change: Secondary | ICD-10-CM | POA: Diagnosis not present

## 2016-09-23 DIAGNOSIS — H1830 Unspecified corneal membrane change: Secondary | ICD-10-CM | POA: Diagnosis not present

## 2016-11-04 DIAGNOSIS — H18422 Band keratopathy, left eye: Secondary | ICD-10-CM | POA: Diagnosis not present

## 2016-11-12 ENCOUNTER — Telehealth: Payer: Self-pay | Admitting: Family Medicine

## 2016-11-12 NOTE — Telephone Encounter (Signed)
Called Pt to schedule AWV with [facility] - knb °

## 2016-12-20 DIAGNOSIS — H401132 Primary open-angle glaucoma, bilateral, moderate stage: Secondary | ICD-10-CM | POA: Diagnosis not present

## 2016-12-23 DIAGNOSIS — H1830 Unspecified corneal membrane change: Secondary | ICD-10-CM | POA: Diagnosis not present

## 2016-12-30 DIAGNOSIS — H1830 Unspecified corneal membrane change: Secondary | ICD-10-CM | POA: Diagnosis not present

## 2017-01-13 DIAGNOSIS — H1830 Unspecified corneal membrane change: Secondary | ICD-10-CM | POA: Diagnosis not present

## 2017-01-24 DIAGNOSIS — H1830 Unspecified corneal membrane change: Secondary | ICD-10-CM | POA: Diagnosis not present

## 2017-02-08 DIAGNOSIS — H18422 Band keratopathy, left eye: Secondary | ICD-10-CM | POA: Diagnosis not present

## 2017-02-15 ENCOUNTER — Other Ambulatory Visit: Payer: Self-pay | Admitting: Family Medicine

## 2017-02-25 DIAGNOSIS — H18422 Band keratopathy, left eye: Secondary | ICD-10-CM | POA: Diagnosis not present

## 2017-02-28 ENCOUNTER — Ambulatory Visit (INDEPENDENT_AMBULATORY_CARE_PROVIDER_SITE_OTHER): Payer: PPO | Admitting: Family Medicine

## 2017-02-28 ENCOUNTER — Encounter: Payer: Self-pay | Admitting: Family Medicine

## 2017-02-28 VITALS — BP 132/58 | HR 66 | Temp 97.7°F | Resp 16 | Wt 144.0 lb

## 2017-02-28 DIAGNOSIS — E039 Hypothyroidism, unspecified: Secondary | ICD-10-CM | POA: Diagnosis not present

## 2017-02-28 DIAGNOSIS — I10 Essential (primary) hypertension: Secondary | ICD-10-CM | POA: Diagnosis not present

## 2017-02-28 DIAGNOSIS — R739 Hyperglycemia, unspecified: Secondary | ICD-10-CM | POA: Diagnosis not present

## 2017-02-28 LAB — POCT GLYCOSYLATED HEMOGLOBIN (HGB A1C): Hemoglobin A1C: 5.9

## 2017-02-28 NOTE — Progress Notes (Signed)
Subjective:  HPI   Hypertension, follow-up:  BP Readings from Last 3 Encounters:  02/28/17 (!) 132/58  09/01/16 134/60  05/04/16 (!) 144/68    She was last seen for hypertension 6 months ago.  BP at that visit was 134/60. Management since that visit includes none. She reports good compliance with treatment. She is not having side effects.  She is exercising. She is adherent to low salt diet.   Outside blood pressures are not being checked. She is experiencing none.  Patient denies chest pain, chest pressure/discomfort, claudication, dyspnea, exertional chest pressure/discomfort, fatigue, irregular heart beat, lower extremity edema, near-syncope, orthopnea, palpitations, paroxysmal nocturnal dyspnea, syncope and tachypnea.    Wt Readings from Last 3 Encounters:  02/28/17 144 lb (65.3 kg)  09/01/16 139 lb (63 kg)  05/04/16 135 lb (61.2 kg)   ------------------------------------------------------------------------    Prior to Admission medications   Medication Sig Start Date End Date Taking? Authorizing Provider  aspirin 81 MG tablet Take by mouth.    [provider]  Calcium-Magnesium-Vitamin D (CALCIUM 500 PO) Take by mouth daily.    [provider]  fluocinonide (LIDEX) 0.05 % external solution FLUOCINONIDE, 0.05% (External Solution)  1 (one) Solution Solution to scalp PRN for Eczema for 0 days  Quantity: 60;  Refills: 6   Ordered :04-Jun-2014  [place], MA, Anastasiya ;  Started 04-Jun-2014 Active 06/04/14   [provider]  hydrochlorothiazide (HYDRODIURIL) 25 MG tablet TAKE ONE (1) TABLET EACH DAY 02/15/17   Jerrol Banana., MD  moxifloxacin (VIGAMOX) 0.5 % ophthalmic solution 1 drop 3 (three) times daily.    [provider]  Multiple Vitamins tablet Take by mouth. 07/31/13   [provider]  vitamin B-12 (CYANOCOBALAMIN) 1000 MCG tablet Take 1,000 mcg by mouth daily.    [provider]    Patient  Active Problem List   Diagnosis Date Noted  . Mastalgia in female 01/31/2016  . Adult hypothyroidism 07/17/2015  . Dermatitis, eczematoid 07/17/2015  . BP (high blood pressure) 07/17/2015  . Blood glucose elevated 07/17/2015  . Combined fat and carbohydrate induced hyperlipemia 07/17/2015  . Osteopenia 07/17/2015  . Breast mass 03/08/2014  . Breast calcifications 10/10/2013  . Flat epithelial atypia of breast 08/17/2013  . Atheroma, skin 05/23/2009  . Glaucoma 05/03/2008    Past Medical History:  Diagnosis Date  . Fracture    right arm  . Glaucoma     Social History   Social History  . Marital status: Widowed    Spouse name: N/A  . Number of children: N/A  . Years of education: N/A   Occupational History  . Not on file.   Social History Main Topics  . Smoking status: Never Smoker  . Smokeless tobacco: Never Used  . Alcohol use No  . Drug use: No  . Sexual activity: Not on file   Other Topics Concern  . Not on file   Social History Narrative  . No narrative on file    Allergies  Allergen Reactions  . Betagan  [Levobunolol Hcl] Other (See Comments)    Not Assessed  . Brimonidine Tartrate Other (See Comments)    Not Assessed Itching eyes  . Other Other (See Comments)    PROPINE. PROPINE -itching eyes  . Sulfa Antibiotics Nausea Only    Review of Systems  Constitutional: Negative.   HENT: Negative.   Eyes: Negative.   Respiratory: Negative.   Cardiovascular: Negative.   Gastrointestinal: Negative.   Genitourinary:  Negative.   Musculoskeletal: Positive for back pain.  Endo/Heme/Allergies: Negative.   Psychiatric/Behavioral: Negative.     Immunization History  Administered Date(s) Administered  . Influenza, High Dose Seasonal PF 07/22/2015, 06/23/2016  . Pneumococcal Conjugate-13 06/04/2014  . Pneumococcal Polysaccharide-23 07/23/2006  . Td 12/27/2008  . Zoster 04/26/2012    Objective:  BP (!) 132/58 (BP Location: Left Arm, Patient Position:  Sitting, Cuff Size: Normal)   Pulse 66   Temp 97.7 F (36.5 C) (Oral)   Resp 16   Wt 144 lb (65.3 kg)   BMI 24.72 kg/m   Physical Exam  Constitutional: She is oriented to person, place, and time and well-developed, well-nourished, and in no distress.  HENT:  Head: Normocephalic and atraumatic.  Eyes: Conjunctivae and EOM are normal. Pupils are equal, round, and reactive to light.  Neck: Normal range of motion. Neck supple.  Cardiovascular: Normal rate, regular rhythm, normal heart sounds and intact distal pulses.   Pulmonary/Chest: Effort normal and breath sounds normal.  Abdominal: Soft. Bowel sounds are normal.  Musculoskeletal: Normal range of motion. She exhibits tenderness (mild tenderness of right greater trochanter. ).  Left ring finger traumatiocally missing since age 83.  Neurological: She is alert and oriented to person, place, and time. She has normal reflexes. Gait normal. GCS score is 15.  Skin: Skin is warm and dry.  Fair skin/AKs.  Psychiatric: Mood, memory, affect and judgment normal.    Lab Results  Component Value Date   WBC 4.7 09/03/2016   HGB 13.2 09/03/2016   HCT 38.5 09/03/2016   PLT 185 09/03/2016   GLUCOSE 102 (H) 09/03/2016   CHOL 227 (H) 09/03/2016   TRIG 91 09/03/2016   HDL 72 09/03/2016   LDLCALC 137 (H) 09/03/2016   TSH 2.870 09/03/2016   HGBA1C 5.7 (H) 09/03/2016    CMP     Component Value Date/Time   NA 143 09/03/2016 0854   K 4.3 09/03/2016 0854   CL 100 09/03/2016 0854   CO2 29 09/03/2016 0854   GLUCOSE 102 (H) 09/03/2016 0854   BUN 26 09/03/2016 0854   CREATININE 1.66 (H) 09/03/2016 0854   CALCIUM 10.0 09/03/2016 0854   PROT 6.5 09/03/2016 0854   ALBUMIN 4.1 09/03/2016 0854   AST 16 09/03/2016 0854   ALT 15 09/03/2016 0854   ALKPHOS 65 09/03/2016 0854   BILITOT 0.4 09/03/2016 0854   GFRNONAA 27 (L) 09/03/2016 0854   GFRAA 31 (L) 09/03/2016 0854    Assessment and Plan :  1. Blood glucose elevated  - POCT HgB A1C 5.9  today. Stable.  2. Essential hypertension   3. Adult hypothyroidism   HPI, Exam, and A&P Transcribed under the direction and in the presence of Richard L. Cranford Mon, MD  Electronically Signed: Katina Dung, CMA I have done the exam and reviewed the above chart and it is accurate to the best of my knowledge. Development worker, community has been used in this note in any air is in the dictation or transcription are unintentional.  [facility] 02/28/2017 1:53 PM

## 2017-02-28 NOTE — Patient Instructions (Addendum)
Schedule appointment for the dermatologist. Check back with [place] about the shingles vaccine to see if we have it yet.

## 2017-03-03 ENCOUNTER — Ambulatory Visit: Payer: PPO | Admitting: Family Medicine

## 2017-03-28 DIAGNOSIS — H18422 Band keratopathy, left eye: Secondary | ICD-10-CM | POA: Diagnosis not present

## 2017-04-13 ENCOUNTER — Telehealth: Payer: Self-pay | Admitting: Family Medicine

## 2017-04-13 NOTE — Telephone Encounter (Signed)
Left message regarding need to schedule AWV w [facility]

## 2017-04-26 ENCOUNTER — Ambulatory Visit (INDEPENDENT_AMBULATORY_CARE_PROVIDER_SITE_OTHER): Payer: PPO

## 2017-04-26 VITALS — BP 138/64 | HR 72 | Temp 97.5°F | Ht 64.0 in | Wt 142.6 lb

## 2017-04-26 DIAGNOSIS — Z0000 Encounter for general adult medical examination without abnormal findings: Secondary | ICD-10-CM

## 2017-04-26 NOTE — Progress Notes (Signed)
Subjective:   Felicia Weaver is a 83 y.o. female who presents for Medicare Annual (Subsequent) preventive examination.  Review of Systems:  N/A  Cardiac Risk Factors include: advanced age (>84men, >35 women);hypertension;dyslipidemia     Objective:     Vitals: BP 138/64 (BP Location: Right Arm)   Pulse 72   Temp (!) 97.5 F (36.4 C) (Oral)   Ht 5\' 4"  (1.626 m)   Wt 142 lb 9.6 oz (64.7 kg)   BMI 24.48 kg/m   Body mass index is 24.48 kg/m.   Tobacco History  Smoking Status  . Never Smoker  Smokeless Tobacco  . Never Used     Counseling given: Not Answered   Past Medical History:  Diagnosis Date  . Fracture    right arm  . Glaucoma   . Hyperlipidemia   . Hypertension    Past Surgical History:  Procedure Laterality Date  . BREAST BIOPSY Left 07-30-13   stereotatic  . BREAST BIOPSY Left 08/28/13   benign/Flat epithelial atypia without severe dysplasia or malignancy. Surgical margins free of atypia  . CHOLECYSTECTOMY  Nov 2012  . HAND SURGERY    . MOHS SURGERY     Family History  Problem Relation Age of Onset  . Hypertension Sister   . Dementia Sister   . Dementia Mother   . Stroke Father   . Heart attack Brother   . Cancer Brother   . Stroke Brother   . Heart disease Sister    History  Sexual Activity  . Sexual activity: Not on file    Outpatient Encounter Prescriptions as of 04/26/2017  Medication Sig  . aspirin 81 MG tablet Take 81 mg by mouth daily.   . Calcium-Magnesium-Vitamin D (CALCIUM 500 PO) Take by mouth daily.  . Difluprednate (DUREZOL) 0.05 % EMUL Apply 1 drop to eye daily.  . fluocinonide (LIDEX) 0.05 % external solution FLUOCINONIDE, 0.05% (External Solution)  1 (one) Solution Solution to scalp PRN for Eczema for 0 days  Quantity: 60;  Refills: 6   Ordered :04-Jun-2014  Celene Kras, MA, Anastasiya ;  Started 04-Jun-2014 Active  . hydrochlorothiazide (HYDRODIURIL) 25 MG tablet TAKE ONE (1) TABLET EACH DAY  . Multiple Vitamins  tablet Take by mouth.  . naproxen sodium (ANAPROX) 220 MG tablet Take 220 mg by mouth 2 (two) times daily with a meal.   . vitamin B-12 (CYANOCOBALAMIN) 1000 MCG tablet Take 1,000 mcg by mouth daily.  . [DISCONTINUED] moxifloxacin (VIGAMOX) 0.5 % ophthalmic solution 1 drop 3 (three) times daily.  . [DISCONTINUED] dexamethasone (DECADRON) injection 4 mg    No facility-administered encounter medications on file as of 04/26/2017.     Activities of Daily Living In your present state of health, do you have any difficulty performing the following activities: 04/26/2017 02/28/2017  Hearing? N N  Vision? Y N  Difficulty concentrating or making decisions? Y N  Walking or climbing stairs? N N  Dressing or bathing? N N  Doing errands, shopping? Y N  Preparing Food and eating ? N -  Using the Toilet? N -  In the past six months, have you accidently leaked urine? Y -  Comment wears protection -  Do you have problems with loss of bowel control? N -  Managing your Medications? N -  Managing your Finances? Y -  Housekeeping or managing your Housekeeping? N -  Some recent data might be hidden    Patient Care Team: Jerrol Banana., MD as PCP -  General (Family Medicine) Dingeldein, Remo Lipps, MD as Consulting Physician (Ophthalmology)    Assessment:     Exercise Activities and Dietary recommendations Current Exercise Habits: Structured exercise class (at living facility), Type of exercise: stretching;walking, Time (Minutes): 30 (plus an extra 30 minute class 3 times a week), Frequency (Times/Week): 7, Weekly Exercise (Minutes/Week): 210, Intensity: Mild  Goals    . Increase water intake          Recommend increasing water intake to 6 glasses a day.       Fall Risk Fall Risk  04/26/2017 02/28/2017 07/22/2015  Falls in the past year? Yes Yes No  Number falls in past yr: 1 1 -  Injury with Fall? No Yes -  Follow up Falls prevention discussed - -   Depression Screen PHQ 2/9 Scores  04/26/2017 02/28/2017 07/22/2015 07/22/2015  PHQ - 2 Score 0 0 0 0  PHQ- 9 Score 2 0 13 -     Cognitive Function     6CIT Screen 04/26/2017  What Year? 0 points  What month? 0 points  What time? 0 points  Count back from 20 0 points  Months in reverse 4 points  Repeat phrase 8 points  Total Score 12    Immunization History  Administered Date(s) Administered  . Influenza, High Dose Seasonal PF 07/22/2015, 06/23/2016  . Pneumococcal Conjugate-13 06/04/2014  . Pneumococcal Polysaccharide-23 07/23/2006  . Td 12/27/2008  . Zoster 04/26/2012   Screening Tests Health Maintenance  Topic Date Due  . INFLUENZA VACCINE  04/13/2017  . DEXA SCAN  09/13/2026 (Originally 04/16/1992)  . TETANUS/TDAP  12/28/2018  . PNA vac Low Risk Adult  Completed      Plan:  I have personally reviewed and addressed the Medicare Annual Wellness questionnaire and have noted the following in the patient's chart:  A. Medical and social history B. Use of alcohol, tobacco or illicit drugs  C. Current medications and supplements D. Functional ability and status E.  Nutritional status F.  Physical activity G. Advance directives H. List of other physicians I.  Hospitalizations, surgeries, and ER visits in previous 12 months [facility] such as hearing and vision if needed, cognitive and depression L. Referrals and appointments - none  In addition, I have reviewed and discussed with patient certain preventive protocols, quality metrics, and best practice recommendations. A written personalized care plan for preventive services as well as general preventive health recommendations were provided to patient.  See attached scanned questionnaire for additional information.   Signed,  Fabio Neighbors, LPN Nurse Health Advisor   MD Recommendations: None.

## 2017-04-26 NOTE — Patient Instructions (Signed)
Felicia Weaver , Thank you for taking time to come for your Medicare Wellness Visit. I appreciate your ongoing commitment to your health goals. Please review the following plan we discussed and let me know if I can assist you in the future.   Screening recommendations/referrals: Colonoscopy: completed per patient Mammogram: completed 02/19/14 Bone Density: completed per patient Recommended yearly ophthalmology/optometry visit for glaucoma screening and checkup Recommended yearly dental visit for hygiene and checkup  Vaccinations: Influenza vaccine: due fall 2018 Pneumococcal vaccine: series completed  Tdap vaccine: completed 12/27/08, due 12/2018 Shingles vaccine: completed 04/26/12  Advanced directives: Please bring a copy of your POA (Power of [place]) and/or Living Will to your next appointment.   Conditions/risks identified: Fall risk prevention; Recommend increasing water intake to 6 glasses a day.   Next appointment: 07/25/17   Preventive Care 81 Years and Older, Female Preventive care refers to lifestyle choices and visits with your health care provider that can promote health and wellness. What does preventive care include?  A yearly physical exam. This is also called an annual well check.  Dental exams once or twice a year.  Routine eye exams. Ask your health care provider how often you should have your eyes checked.  Personal lifestyle choices, including:  Daily care of your teeth and gums.  Regular physical activity.  Eating a healthy diet.  Avoiding tobacco and drug use.  Limiting alcohol use.  Practicing safe sex.  Taking low-dose aspirin every day.  Taking vitamin and mineral supplements as recommended by your health care provider. What happens during an annual well check? The services and screenings done by your health care provider during your annual well check will depend on your age, overall health, lifestyle risk factors, and family history of  disease. Counseling  Your health care provider may ask you questions about your:  Alcohol use.  Tobacco use.  Drug use.  Emotional well-being.  Home and relationship well-being.  Sexual activity.  Eating habits.  History of falls.  Memory and ability to understand (cognition).  Work and work Statistician.  Reproductive health. Screening  You may have the following tests or measurements:  Height, weight, and BMI.  Blood pressure.  Lipid and cholesterol levels. These may be checked every 5 years, or more frequently if you are over 34 years old.  Skin check.  Lung cancer screening. You may have this screening every year starting at age 72 if you have a 30-pack-year history of smoking and currently smoke or have quit within the past 81 years.  Fecal occult blood test (FOBT) of the stool. You may have this test every year starting at age 81.  Flexible sigmoidoscopy or colonoscopy. You may have a sigmoidoscopy every 5 years or a colonoscopy every 10 years starting at age 81.  Hepatitis C blood test.  Hepatitis B blood test.  Sexually transmitted disease (STD) testing.  Diabetes screening. This is done by checking your blood sugar (glucose) after you have not eaten for a while (fasting). You may have this done every 1-3 years.  Bone density scan. This is done to screen for osteoporosis. You may have this done starting at age 81.  Mammogram. This may be done every 1-2 years. Talk to your health care provider about how often you should have regular mammograms. Talk with your health care provider about your test results, treatment options, and if necessary, the need for more tests. Vaccines  Your health care provider may recommend certain vaccines, such as:  Influenza vaccine.  This is recommended every year.  Tetanus, diphtheria, and acellular pertussis (Tdap, Td) vaccine. You may need a Td booster every 10 years.  Zoster vaccine. You may need this after age  40.  Pneumococcal 13-valent conjugate (PCV13) vaccine. One dose is recommended after age 81.  Pneumococcal polysaccharide (PPSV23) vaccine. One dose is recommended after age 31. Talk to your health care provider about which screenings and vaccines you need and how often you need them. This information is not intended to replace advice given to you by your health care provider. Make sure you discuss any questions you have with your health care provider. Document Released: 09/26/2015 Document Revised: 05/19/2016 Document Reviewed: 07/01/2015 Elsevier Interactive Patient Education  2017 [place] Prevention in the Home Falls can cause injuries. They can happen to people of all ages. There are many things you can do to make your home safe and to help prevent falls. What can I do on the outside of my home?  Regularly fix the edges of walkways and driveways and fix any cracks.  Remove anything that might make you trip as you walk through a door, such as a raised step or threshold.  Trim any bushes or trees on the path to your home.  Use bright outdoor lighting.  Clear any walking paths of anything that might make someone trip, such as rocks or tools.  Regularly check to see if handrails are loose or broken. Make sure that both sides of any steps have handrails.  Any raised decks and porches should have guardrails on the edges.  Have any leaves, snow, or ice cleared regularly.  Use sand or salt on walking paths during winter.  Clean up any spills in your garage right away. This includes oil or grease spills. What can I do in the bathroom?  Use night lights.  Install grab bars by the toilet and in the tub and shower. Do not use towel bars as grab bars.  Use non-skid mats or decals in the tub or shower.  If you need to sit down in the shower, use a plastic, non-slip stool.  Keep the floor dry. Clean up any water that spills on the floor as soon as it happens.  Remove  soap buildup in the tub or shower regularly.  Attach bath mats securely with double-sided non-slip rug tape.  Do not have throw rugs and other things on the floor that can make you trip. What can I do in the bedroom?  Use night lights.  Make sure that you have a light by your bed that is easy to reach.  Do not use any sheets or blankets that are too big for your bed. They should not hang down onto the floor.  Have a firm chair that has side arms. You can use this for support while you get dressed.  Do not have throw rugs and other things on the floor that can make you trip. What can I do in the kitchen?  Clean up any spills right away.  Avoid walking on wet floors.  Keep items that you use a lot in easy-to-reach places.  If you need to reach something above you, use a strong step stool that has a grab bar.  Keep electrical cords out of the way.  Do not use floor polish or wax that makes floors slippery. If you must use wax, use non-skid floor wax.  Do not have throw rugs and other things on the floor that can make  you trip. What can I do with my stairs?  Do not leave any items on the stairs.  Make sure that there are handrails on both sides of the stairs and use them. Fix handrails that are broken or loose. Make sure that handrails are as long as the stairways.  Check any carpeting to make sure that it is firmly attached to the stairs. Fix any carpet that is loose or worn.  Avoid having throw rugs at the top or bottom of the stairs. If you do have throw rugs, attach them to the floor with carpet tape.  Make sure that you have a light switch at the top of the stairs and the bottom of the stairs. If you do not have them, ask someone to add them for you. What else can I do to help prevent falls?  Wear shoes that:  Do not have high heels.  Have rubber bottoms.  Are comfortable and fit you well.  Are closed at the toe. Do not wear sandals.  If you use a  stepladder:  Make sure that it is fully opened. Do not climb a closed stepladder.  Make sure that both sides of the stepladder are locked into place.  Ask someone to hold it for you, if possible.  Clearly mark and make sure that you can see:  Any grab bars or handrails.  First and last steps.  Where the edge of each step is.  Use tools that help you move around (mobility aids) if they are needed. These include:  Canes.  Walkers.  Scooters.  Crutches.  Turn on the lights when you go into a dark area. Replace any light bulbs as soon as they burn out.  Set up your furniture so you have a clear path. Avoid moving your furniture around.  If any of your floors are uneven, fix them.  If there are any pets around you, be aware of where they are.  Review your medicines with your doctor. Some medicines can make you feel dizzy. This can increase your chance of falling. Ask your doctor what other things that you can do to help prevent falls. This information is not intended to replace advice given to you by your health care provider. Make sure you discuss any questions you have with your health care provider. Document Released: 06/26/2009 Document Revised: 02/05/2016 Document Reviewed: 10/04/2014 Elsevier Interactive Patient Education  2017 [employer].

## 2017-04-28 DIAGNOSIS — H18422 Band keratopathy, left eye: Secondary | ICD-10-CM | POA: Diagnosis not present

## 2017-05-23 ENCOUNTER — Telehealth: Payer: Self-pay | Admitting: Family Medicine

## 2017-05-23 NOTE — Telephone Encounter (Signed)
Pt's daughter called caying she has already had her medicare wellness visit for this year.  Thanks [employer]

## 2017-06-09 DIAGNOSIS — H18422 Band keratopathy, left eye: Secondary | ICD-10-CM | POA: Diagnosis not present

## 2017-06-15 NOTE — Telephone Encounter (Signed)
Record updated

## 2017-07-12 ENCOUNTER — Other Ambulatory Visit
Admission: RE | Admit: 2017-07-12 | Discharge: 2017-07-12 | Disposition: A | Discharge status: Home / Self Care | Payer: PPO | Source: Ambulatory Visit | Attending: Ophthalmology | Admitting: Ophthalmology

## 2017-07-12 DIAGNOSIS — H16002 Unspecified corneal ulcer, left eye: Secondary | ICD-10-CM | POA: Insufficient documentation

## 2017-07-13 DIAGNOSIS — H16002 Unspecified corneal ulcer, left eye: Secondary | ICD-10-CM | POA: Diagnosis not present

## 2017-07-14 DIAGNOSIS — H18732 Descemetocele, left eye: Secondary | ICD-10-CM | POA: Diagnosis not present

## 2017-07-14 DIAGNOSIS — H16072 Perforated corneal ulcer, left eye: Secondary | ICD-10-CM | POA: Diagnosis not present

## 2017-07-14 DIAGNOSIS — H16002 Unspecified corneal ulcer, left eye: Secondary | ICD-10-CM | POA: Diagnosis not present

## 2017-07-15 DIAGNOSIS — H16002 Unspecified corneal ulcer, left eye: Secondary | ICD-10-CM | POA: Diagnosis not present

## 2017-07-16 LAB — AEROBIC CULTURE W GRAM STAIN (SUPERFICIAL SPECIMEN)

## 2017-07-16 LAB — AEROBIC CULTURE  (SUPERFICIAL SPECIMEN)

## 2017-07-18 DIAGNOSIS — H16002 Unspecified corneal ulcer, left eye: Secondary | ICD-10-CM | POA: Diagnosis not present

## 2017-07-22 DIAGNOSIS — H16002 Unspecified corneal ulcer, left eye: Secondary | ICD-10-CM | POA: Diagnosis not present

## 2017-07-25 ENCOUNTER — Encounter: Payer: Self-pay | Admitting: Family Medicine

## 2017-07-25 ENCOUNTER — Ambulatory Visit: Payer: PPO | Admitting: Family Medicine

## 2017-07-25 VITALS — BP 138/60 | HR 72 | Temp 97.7°F | Resp 14 | Wt 144.0 lb

## 2017-07-25 DIAGNOSIS — I10 Essential (primary) hypertension: Secondary | ICD-10-CM | POA: Diagnosis not present

## 2017-07-25 DIAGNOSIS — S0500XS Injury of conjunctiva and corneal abrasion without foreign body, unspecified eye, sequela: Secondary | ICD-10-CM

## 2017-07-25 DIAGNOSIS — Z23 Encounter for immunization: Secondary | ICD-10-CM | POA: Diagnosis not present

## 2017-07-25 DIAGNOSIS — R739 Hyperglycemia, unspecified: Secondary | ICD-10-CM

## 2017-07-25 NOTE — Progress Notes (Signed)
Patient: Felicia Weaver Female    DOB: [birth year]   83 y.o.   MRN: [id number] Visit Date: 07/25/2017  Today's Provider: [name], MD   Chief Complaint  Patient presents with  . Hypertension  . Hyperglycemia   Subjective:    HPI  Hypertension, follow-up:  BP Readings from Last 3 Encounters:  07/25/17 138/60  04/26/17 138/64  02/28/17 (!) 132/58    She was last seen for hypertension 5 months ago.  BP at that visit was 138/64. Management since that visit includes none. She reports good compliance with treatment. She is not having side effects.  She is exercising. She is adherent to low salt diet.   Outside blood pressures are not being checked. Patient denies chest pain, chest pressure/discomfort, claudication, dyspnea, exertional chest pressure/discomfort, fatigue, irregular heart beat, lower extremity edema, near-syncope, orthopnea, palpitations, paroxysmal nocturnal dyspnea, syncope and tachypnea.   Cardiovascular risk factors include advanced age (older than 49 for men, 22 for women) and hypertension.   Wt Readings from Last 3 Encounters:  07/25/17 144 lb (65.3 kg)  04/26/17 142 lb 9.6 oz (64.7 kg)  02/28/17 144 lb (65.3 kg)   ------------------------------------------------------------------------      Allergies  Allergen Reactions  . Betagan  [Levobunolol Hcl] Other (See Comments)    Not Assessed  . Brimonidine Tartrate Other (See Comments)    Not Assessed Itching eyes  . Other Other (See Comments)    PROPINE. PROPINE -itching eyes  . Sulfa Antibiotics Nausea Only     Current Outpatient Medications:  .  aspirin 81 MG tablet, Take 81 mg by mouth daily. , Disp: , Rfl:  .  Calcium-Magnesium-Vitamin D (CALCIUM 500 PO), Take by mouth daily., Disp: , Rfl:  .  Difluprednate (DUREZOL) 0.05 % EMUL, Apply 1 drop to eye daily., Disp: , Rfl:  .  fluocinonide (LIDEX) 0.05 % external solution, FLUOCINONIDE, 0.05% (External Solution)  1 (one) Solution  Solution to scalp PRN for Eczema for 0 days  Quantity: 60;  Refills: 6   Ordered :04-Jun-2014  Celene Kras, MA, Anastasiya ;  Started 04-Jun-2014 Active, Disp: , Rfl:  .  hydrochlorothiazide (HYDRODIURIL) 25 MG tablet, TAKE ONE (1) TABLET EACH DAY, Disp: 90 tablet, Rfl: 3 .  moxifloxacin (VIGAMOX) 0.5 % ophthalmic solution, Administer 1 drop into the left eye every hour., Disp: , Rfl:  .  Multiple Vitamins tablet, Take by mouth., Disp: , Rfl:  .  naproxen sodium (ANAPROX) 220 MG tablet, Take 220 mg by mouth 2 (two) times daily with a meal. , Disp: , Rfl:  .  vitamin B-12 (CYANOCOBALAMIN) 1000 MCG tablet, Take 1,000 mcg by mouth daily., Disp: , Rfl:   Review of Systems  Constitutional: Negative.   HENT: Negative.   Eyes: Negative.   Respiratory: Negative.   Cardiovascular: Negative.   Gastrointestinal: Negative.   Endocrine: Negative.   Genitourinary: Negative.   Musculoskeletal: Positive for myalgias (leg cramps).  Skin: Negative.   Allergic/Immunologic: Negative.   Neurological: Negative.   Hematological: Negative.   Psychiatric/Behavioral: Negative.     Social History   Tobacco Use  . Smoking status: Never Smoker  . Smokeless tobacco: Never Used  Substance Use Topics  . Alcohol use: No   Objective:   BP 138/60 (BP Location: Left Arm, Patient Position: Sitting, Cuff Size: Normal)   Pulse 72   Temp 97.7 F (36.5 C) (Oral)   Resp 14   Wt 144 lb (65.3 kg)   BMI 24.72  kg/m  Vitals:   07/25/17 1345  BP: 138/60  Pulse: 72  Resp: 14  Temp: 97.7 F (36.5 C)  TempSrc: Oral  Weight: 144 lb (65.3 kg)     Physical Exam  Constitutional: She is oriented to person, place, and time. She appears well-developed and well-nourished.  HENT:  Head: Normocephalic and atraumatic.  Eyes: Conjunctivae and EOM are normal. Pupils are equal, round, and reactive to light.  Neck: Normal range of motion. Neck supple.  Cardiovascular: Normal rate, regular rhythm, normal heart sounds and  intact distal pulses.  Pulmonary/Chest: Effort normal and breath sounds normal.  Abdominal: Soft.  Musculoskeletal: Normal range of motion.  Neurological: She is alert and oriented to person, place, and time. She has normal reflexes.  Skin: Skin is warm and dry.  Psychiatric: She has a normal mood and affect. Her behavior is normal. Judgment and thought content normal.        Assessment & Plan:     1. Essential hypertension  - CBC with Differential/Platelet - TSH  2. Hyperglycemia  - Hemoglobin A1c - Comprehensive metabolic panel  3. Need for influenza vaccination Pt is taking antibiotics for eye infection and tooth infection. Will wait another 2-3 weeks to get this.      HPI, Exam, and A&P Transcribed under the direction and in the presence of Richard L. Cranford Mon, MD  Electronically Signed: Katina Dung, [place], MD  [facility]

## 2017-07-26 DIAGNOSIS — H16002 Unspecified corneal ulcer, left eye: Secondary | ICD-10-CM | POA: Diagnosis not present

## 2017-07-26 LAB — CBC WITH DIFFERENTIAL/PLATELET
BASOS PCT: 0.9 %
Basophils Absolute: 51 cells/uL (ref 0–200)
EOS ABS: 51 {cells}/uL (ref 15–500)
EOS PCT: 0.9 %
HEMATOCRIT: 38.4 % (ref 35.0–45.0)
HEMOGLOBIN: 13.2 g/dL (ref 11.7–15.5)
LYMPHS ABS: 1208 {cells}/uL (ref 850–3900)
MCH: 31.4 pg (ref 27.0–33.0)
MCHC: 34.4 g/dL (ref 32.0–36.0)
MCV: 91.2 fL (ref 80.0–100.0)
MONOS PCT: 7.7 %
MPV: 11.8 fL (ref 7.5–12.5)
NEUTROS ABS: 3950 {cells}/uL (ref 1500–7800)
Neutrophils Relative %: 69.3 %
Platelets: 189 10*3/uL (ref 140–400)
RBC: 4.21 10*6/uL (ref 3.80–5.10)
RDW: 11.9 % (ref 11.0–15.0)
Total Lymphocyte: 21.2 %
WBC mixed population: 439 cells/uL (ref 200–950)
WBC: 5.7 10*3/uL (ref 3.8–10.8)

## 2017-07-26 LAB — COMPLETE METABOLIC PANEL WITH GFR
AG RATIO: 1.5 (calc) (ref 1.0–2.5)
ALT: 11 U/L (ref 6–29)
AST: 15 U/L (ref 10–35)
Albumin: 4.1 g/dL (ref 3.6–5.1)
Alkaline phosphatase (APISO): 63 U/L (ref 33–130)
BUN/Creatinine Ratio: 18 (calc) (ref 6–22)
BUN: 36 mg/dL — ABNORMAL HIGH (ref 7–25)
CALCIUM: 10.4 mg/dL (ref 8.6–10.4)
CO2: 34 mmol/L — ABNORMAL HIGH (ref 20–32)
CREATININE: 1.99 mg/dL — AB (ref 0.60–0.88)
Chloride: 99 mmol/L (ref 98–110)
GFR, EST AFRICAN AMERICAN: 25 mL/min/{1.73_m2} — AB (ref >=60)
GFR, EST NON AFRICAN AMERICAN: 22 mL/min/{1.73_m2} — AB (ref >=60)
GLOBULIN: 2.7 g/dL (ref 1.9–3.7)
Glucose, Bld: 144 mg/dL — ABNORMAL HIGH (ref 65–99)
POTASSIUM: 4.2 mmol/L (ref 3.5–5.3)
Sodium: 139 mmol/L (ref 135–146)
TOTAL PROTEIN: 6.8 g/dL (ref 6.1–8.1)
Total Bilirubin: 0.4 mg/dL (ref 0.2–1.2)

## 2017-07-26 LAB — HEMOGLOBIN A1C
EAG (MMOL/L): 6.6 (calc)
HEMOGLOBIN A1C: 5.8 %{Hb} — AB (ref <5.7)
MEAN PLASMA GLUCOSE: 120 (calc)

## 2017-07-26 LAB — TSH: TSH: 1.67 m[IU]/L (ref 0.40–4.50)

## 2017-07-29 DIAGNOSIS — H16002 Unspecified corneal ulcer, left eye: Secondary | ICD-10-CM | POA: Diagnosis not present

## 2017-08-02 DIAGNOSIS — H16002 Unspecified corneal ulcer, left eye: Secondary | ICD-10-CM | POA: Diagnosis not present

## 2017-08-06 LAB — CULTURE, FUNGUS WITHOUT SMEAR

## 2017-08-08 DIAGNOSIS — H16002 Unspecified corneal ulcer, left eye: Secondary | ICD-10-CM | POA: Diagnosis not present

## 2017-08-10 ENCOUNTER — Telehealth: Payer: Self-pay

## 2017-08-10 NOTE — Telephone Encounter (Signed)
-----   Message from Jerrol Banana., MD sent at 07/27/2017  8:09 AM EST ----- Labs stable except kidneys are not as strong. Would avoid any ibuprofen or Motrin or Aleve. Can refer to kidney specialists if they want. Increase water consumption a little bit during the day. Will follow this with labs on her next visit

## 2017-08-10 NOTE — Telephone Encounter (Signed)
Daughter states that she has been advised of message already by CMA and no action is being taken at this time. KW

## 2017-08-15 DIAGNOSIS — H16002 Unspecified corneal ulcer, left eye: Secondary | ICD-10-CM | POA: Diagnosis not present

## 2017-08-24 ENCOUNTER — Ambulatory Visit (INDEPENDENT_AMBULATORY_CARE_PROVIDER_SITE_OTHER): Payer: PPO | Admitting: Family Medicine

## 2017-08-24 DIAGNOSIS — H16002 Unspecified corneal ulcer, left eye: Secondary | ICD-10-CM | POA: Diagnosis not present

## 2017-08-24 DIAGNOSIS — Z23 Encounter for immunization: Secondary | ICD-10-CM | POA: Diagnosis not present

## 2017-09-07 DIAGNOSIS — H16002 Unspecified corneal ulcer, left eye: Secondary | ICD-10-CM | POA: Diagnosis not present

## 2017-09-16 DIAGNOSIS — H16002 Unspecified corneal ulcer, left eye: Secondary | ICD-10-CM | POA: Diagnosis not present

## 2017-09-23 DIAGNOSIS — H16002 Unspecified corneal ulcer, left eye: Secondary | ICD-10-CM | POA: Diagnosis not present

## 2017-09-29 DIAGNOSIS — H16002 Unspecified corneal ulcer, left eye: Secondary | ICD-10-CM | POA: Diagnosis not present

## 2017-10-20 DIAGNOSIS — H16002 Unspecified corneal ulcer, left eye: Secondary | ICD-10-CM | POA: Diagnosis not present

## 2017-11-07 DIAGNOSIS — H16002 Unspecified corneal ulcer, left eye: Secondary | ICD-10-CM | POA: Diagnosis not present

## 2017-11-29 DIAGNOSIS — H16002 Unspecified corneal ulcer, left eye: Secondary | ICD-10-CM | POA: Diagnosis not present

## 2017-12-20 DIAGNOSIS — H16002 Unspecified corneal ulcer, left eye: Secondary | ICD-10-CM | POA: Diagnosis not present

## 2017-12-27 DIAGNOSIS — H16002 Unspecified corneal ulcer, left eye: Secondary | ICD-10-CM | POA: Diagnosis not present

## 2018-01-02 DIAGNOSIS — H16002 Unspecified corneal ulcer, left eye: Secondary | ICD-10-CM | POA: Diagnosis not present

## 2018-01-06 DIAGNOSIS — H16002 Unspecified corneal ulcer, left eye: Secondary | ICD-10-CM | POA: Diagnosis not present

## 2018-01-09 DIAGNOSIS — H16002 Unspecified corneal ulcer, left eye: Secondary | ICD-10-CM | POA: Diagnosis not present

## 2018-01-23 ENCOUNTER — Ambulatory Visit: Payer: Self-pay | Admitting: Family Medicine

## 2018-01-23 DIAGNOSIS — H16002 Unspecified corneal ulcer, left eye: Secondary | ICD-10-CM | POA: Diagnosis not present

## 2018-01-25 ENCOUNTER — Ambulatory Visit: Payer: Self-pay | Admitting: Family Medicine

## 2018-01-31 DIAGNOSIS — M7541 Impingement syndrome of right shoulder: Secondary | ICD-10-CM | POA: Diagnosis not present

## 2018-02-15 ENCOUNTER — Ambulatory Visit (INDEPENDENT_AMBULATORY_CARE_PROVIDER_SITE_OTHER): Payer: PPO | Admitting: Family Medicine

## 2018-02-15 ENCOUNTER — Other Ambulatory Visit: Payer: Self-pay | Admitting: Family Medicine

## 2018-02-15 VITALS — BP 128/58 | HR 70 | Temp 97.5°F | Resp 18 | Wt 137.0 lb

## 2018-02-15 DIAGNOSIS — E782 Mixed hyperlipidemia: Secondary | ICD-10-CM | POA: Diagnosis not present

## 2018-02-15 DIAGNOSIS — M791 Myalgia, unspecified site: Secondary | ICD-10-CM | POA: Diagnosis not present

## 2018-02-15 DIAGNOSIS — I10 Essential (primary) hypertension: Secondary | ICD-10-CM

## 2018-02-15 DIAGNOSIS — E039 Hypothyroidism, unspecified: Secondary | ICD-10-CM

## 2018-02-15 NOTE — Progress Notes (Signed)
Felicia Weaver  MRN: [id number] DOB: 1925/04/06  Subjective:  HPI   Patient is a 84 year old female who presents for follow up of her chronic health.  She was last seen on  07/25/17.  She is due for Hgb A1C, lipids and possibly renal.  She is having a lot of leg cramps.They are not severe. Hypertension BP Readings from Last 3 Encounters:  02/15/18 (!) 128/58  07/25/17 138/60  04/26/17 138/64   Diabetes Lab Results  Component Value Date   HGBA1C 5.8 (H) 07/25/2017   Hyperlipidemia- Lab Results  Component Value Date   CHOL 227 (H) 09/03/2016   HDL 72 09/03/2016   LDLCALC 137 (H) 09/03/2016   TRIG 91 09/03/2016    Patient Active Problem List   Diagnosis Date Noted  . Mastalgia in female 01/31/2016  . Adult hypothyroidism 07/17/2015  . Dermatitis, eczematoid 07/17/2015  . BP (high blood pressure) 07/17/2015  . Blood glucose elevated 07/17/2015  . Combined fat and carbohydrate induced hyperlipemia 07/17/2015  . Osteopenia 07/17/2015  . Breast mass 03/08/2014  . Breast calcifications 10/10/2013  . Flat epithelial atypia of breast 08/17/2013  . Atheroma, skin 05/23/2009  . Glaucoma 05/03/2008    Past Medical History:  Diagnosis Date  . Fracture    right arm  . Glaucoma   . Hyperlipidemia   . Hypertension     Social History   Socioeconomic History  . Marital status: Widowed    Spouse name: Not on file  . Number of children: Not on file  . Years of education: Not on file  . Highest education level: Not on file  Occupational History  . Not on file  Social Needs  . Financial resource strain: Not on file  . Food insecurity:    Worry: Not on file    Inability: Not on file  . Transportation needs:    Medical: Not on file    Non-medical: Not on file  Tobacco Use  . Smoking status: Never Smoker  . Smokeless tobacco: Never Used  Substance and Sexual Activity  . Alcohol use: No  . Drug use: No  . Sexual activity: Not on file  Lifestyle  . Physical  activity:    Days per week: Not on file    Minutes per session: Not on file  . Stress: Not on file  Relationships  . Social connections:    Talks on phone: Not on file    Gets together: Not on file    Attends religious service: Not on file    Active member of club or organization: Not on file    Attends meetings of clubs or organizations: Not on file    Relationship status: Not on file  . Intimate partner violence:    Fear of current or ex partner: Not on file    Emotionally abused: Not on file    Physically abused: Not on file    Forced sexual activity: Not on file  Other Topics Concern  . Not on file  Social History Narrative  . Not on file    Outpatient Encounter Medications as of 02/15/2018  Medication Sig Note  . aspirin 81 MG tablet Take 81 mg by mouth daily.  07/17/2015: Received from: [facility]  . Calcium-Magnesium-Vitamin D (CALCIUM 500 PO) Take by mouth daily.   . hydrochlorothiazide (HYDRODIURIL) 25 MG tablet TAKE ONE (1) TABLET EACH DAY   . moxifloxacin (VIGAMOX) 0.5 % ophthalmic solution Administer 1 drop into the left  eye every hour.   . Multiple Vitamins tablet Take by mouth. 07/17/2015: Received from: [facility]  . vitamin B-12 (CYANOCOBALAMIN) 1000 MCG tablet Take 1,000 mcg by mouth daily.   . [DISCONTINUED] Difluprednate (DUREZOL) 0.05 % EMUL Apply 1 drop to eye daily.   . [DISCONTINUED] fluocinonide (LIDEX) 0.05 % external solution FLUOCINONIDE, 0.05% (External Solution)  1 (one) Solution Solution to scalp PRN for Eczema for 0 days  Quantity: 60;  Refills: 6   Ordered :04-Jun-2014  Everetts, MA, Anastasiya ;  Started 04-Jun-2014 Active 07/17/2015: Received from: [facility]  . [DISCONTINUED] naproxen sodium (ANAPROX) 220 MG tablet Take 220 mg by mouth 2 (two) times daily with a meal.     No facility-administered encounter medications on file as of 02/15/2018.     Allergies  Allergen Reactions  .  Betagan  [Levobunolol Hcl] Other (See Comments)    Not Assessed  . Brimonidine Tartrate Other (See Comments)    Not Assessed Itching eyes  . Other Other (See Comments)    PROPINE. PROPINE -itching eyes  . Sulfa Antibiotics Nausea Only    Review of Systems  Constitutional: Negative for fever and malaise/fatigue.  HENT: Negative.   Eyes: Negative.   Respiratory: Negative for cough, shortness of breath and wheezing.   Cardiovascular: Negative for chest pain, palpitations, orthopnea, claudication and leg swelling.  Gastrointestinal: Negative.   Musculoskeletal: Positive for myalgias.       Leg cramps -especially at night.  Skin: Negative.   Neurological: Negative.   Endo/Heme/Allergies: Negative.   Psychiatric/Behavioral: Negative.     Objective:  BP (!) 128/58 (BP Location: Right Arm, Patient Position: Sitting, Cuff Size: Normal)   Pulse 70   Temp (!) 97.5 F (36.4 C) (Oral)   Resp 18   Wt 137 lb (62.1 kg)   BMI 23.52 kg/m   Physical Exam  Constitutional: She is oriented to person, place, and time and well-developed, well-nourished, and in no distress.  HENT:  Head: Normocephalic and atraumatic.  Eyes: Conjunctivae are normal.  Neck: No thyromegaly present.  Cardiovascular: Normal rate, regular rhythm and normal heart sounds.  Pulmonary/Chest: Effort normal and breath sounds normal.  Abdominal: Soft.  Neurological: She is alert and oriented to person, place, and time. Gait normal. GCS score is 15.  Skin: Skin is warm and dry.  Very fair skin.  Psychiatric: Mood, memory, affect and judgment normal.    Assessment and Plan :  1. Essential hypertension  - Comprehensive metabolic panel  2. Combined fat and carbohydrate induced hyperlipemia   3. Adult hypothyroidism  - Hemoglobin A1c 4.Myalgia Mag Ox has not helped. Try mustard at night.  I have done the exam and reviewed the chart and it is accurate to the best of my knowledge. Development worker, community has been used  and  any errors in dictation or transcription are unintentional. Miguel Aschoff M.D. [facility]

## 2018-02-15 NOTE — Patient Instructions (Signed)
Push fluids and use Mustard for leg cramps

## 2018-02-16 ENCOUNTER — Encounter: Payer: Self-pay | Admitting: Family Medicine

## 2018-02-16 ENCOUNTER — Telehealth: Payer: Self-pay

## 2018-02-16 LAB — COMPREHENSIVE METABOLIC PANEL
ALK PHOS: 60 IU/L (ref 39–117)
ALT: 18 IU/L (ref 0–32)
AST: 23 IU/L (ref 0–40)
Albumin/Globulin Ratio: 1.8 (ref 1.2–2.2)
Albumin: 4.2 g/dL (ref 3.2–4.6)
BUN/Creatinine Ratio: 16 (ref 12–28)
BUN: 28 mg/dL (ref 10–36)
Bilirubin Total: 0.4 mg/dL (ref 0.0–1.2)
CALCIUM: 10.7 mg/dL — AB (ref 8.7–10.3)
CO2: 27 mmol/L (ref 20–29)
CREATININE: 1.76 mg/dL — AB (ref 0.57–1.00)
Chloride: 101 mmol/L (ref 96–106)
GFR calc Af Amer: 29 mL/min/{1.73_m2} — ABNORMAL LOW (ref >59)
GFR calc non Af Amer: 25 mL/min/{1.73_m2} — ABNORMAL LOW (ref >59)
GLOBULIN, TOTAL: 2.4 g/dL (ref 1.5–4.5)
GLUCOSE: 113 mg/dL — AB (ref 65–99)
Potassium: 4.2 mmol/L (ref 3.5–5.2)
SODIUM: 143 mmol/L (ref 134–144)
Total Protein: 6.6 g/dL (ref 6.0–8.5)

## 2018-02-16 LAB — HEMOGLOBIN A1C
ESTIMATED AVERAGE GLUCOSE: 123 mg/dL
HEMOGLOBIN A1C: 5.9 % — AB (ref 4.8–5.6)

## 2018-02-16 NOTE — Telephone Encounter (Signed)
Advised daughter of results.  

## 2018-02-16 NOTE — Telephone Encounter (Signed)
-----   Message from Jerrol Banana., MD sent at 02/16/2018 10:37 AM EDT ----- Push water as discussed--stop all Calcium supplements, RTC 3-4 months--then will get MetC and Ironized Calcium and PTH

## 2018-02-16 NOTE — Telephone Encounter (Signed)
Tried calling patient, not good reception. Advised to call back.

## 2018-02-23 DIAGNOSIS — H16002 Unspecified corneal ulcer, left eye: Secondary | ICD-10-CM | POA: Diagnosis not present

## 2018-03-28 DIAGNOSIS — H16002 Unspecified corneal ulcer, left eye: Secondary | ICD-10-CM | POA: Diagnosis not present

## 2018-05-02 ENCOUNTER — Ambulatory Visit (INDEPENDENT_AMBULATORY_CARE_PROVIDER_SITE_OTHER): Payer: PPO

## 2018-05-02 VITALS — BP 142/62 | HR 68 | Temp 98.1°F | Ht 63.0 in | Wt 140.8 lb

## 2018-05-02 DIAGNOSIS — Z0000 Encounter for general adult medical examination without abnormal findings: Secondary | ICD-10-CM | POA: Diagnosis not present

## 2018-05-02 DIAGNOSIS — H16002 Unspecified corneal ulcer, left eye: Secondary | ICD-10-CM | POA: Diagnosis not present

## 2018-05-02 NOTE — Progress Notes (Signed)
Subjective:   Felicia Weaver is a 84 y.o. female who presents for Medicare Annual (Subsequent) preventive examination.  Review of Systems:  N/A   Cardiac Risk Factors include: advanced age (>56men, >4 women);dyslipidemia;hypertension     Objective:     Vitals: BP (!) 142/62 (BP Location: Right Arm)   Pulse 68   Temp 98.1 F (36.7 C) (Oral)   Ht 5\' 3"  (1.6 m)   Wt 140 lb 12.8 oz (63.9 kg)   BMI 24.94 kg/m   Body mass index is 24.94 kg/m.  Advanced Directives 05/02/2018 04/26/2017  Does Patient Have a Medical Advance Directive? Yes Yes  Type of Advance Directive Living will [place];Living will  Does patient want to make changes to medical advance directive? Yes (MAU/Ambulatory/Procedural Areas - Information given) -  Copy of [place] in Chart? - No - copy requested    Tobacco Social History   Tobacco Use  Smoking Status Never Smoker  Smokeless Tobacco Never Used     Counseling given: Not Answered   Clinical Intake:  Pre-visit preparation completed: Yes  Pain : No/denies pain     Nutritional Status: BMI of 19-24  Normal Nutritional Risks: None Diabetes: No  How often do you need to have someone help you when you read instructions, pamphlets, or other written materials from your doctor or pharmacy?: 1 - Never  Interpreter Needed?: No  Information entered by :: [facility], LPN  Past Medical History:  Diagnosis Date  . Fracture    right arm  . Glaucoma   . Hyperlipidemia   . Hypertension    Past Surgical History:  Procedure Laterality Date  . BREAST BIOPSY Left 07-30-13   stereotatic  . BREAST BIOPSY Left 08/28/13   benign/Flat epithelial atypia without severe dysplasia or malignancy. Surgical margins free of atypia  . CHOLECYSTECTOMY  Nov 2012  . HAND SURGERY    . MOHS SURGERY     Family History  Problem Relation Age of Onset  . Hypertension Sister   . Dementia Sister   . Dementia Mother   . Stroke  Father   . Heart attack Brother   . Cancer Brother   . Stroke Brother   . Heart disease Sister    Social History   Socioeconomic History  . Marital status: Widowed    Spouse name: Not on file  . Number of children: 2  . Years of education: Not on file  . Highest education level: High school graduate  Occupational History  . Not on file  Social Needs  . Financial resource strain: Not hard at all  . Food insecurity:    Worry: Never true    Inability: Never true  . Transportation needs:    Medical: No    Non-medical: No  Tobacco Use  . Smoking status: Never Smoker  . Smokeless tobacco: Never Used  Substance and Sexual Activity  . Alcohol use: No  . Drug use: No  . Sexual activity: Not on file  Lifestyle  . Physical activity:    Days per week: Not on file    Minutes per session: Not on file  . Stress: Not at all  Relationships  . Social connections:    Talks on phone: Not on file    Gets together: Not on file    Attends religious service: Not on file    Active member of club or organization: Not on file    Attends meetings of clubs or  organizations: Not on file    Relationship status: Not on file  Other Topics Concern  . Not on file  Social History Narrative  . Not on file    Outpatient Encounter Medications as of 05/02/2018  Medication Sig  . aspirin 81 MG tablet Take 81 mg by mouth daily.   . hydrochlorothiazide (HYDRODIURIL) 25 MG tablet TAKE ONE (1) TABLET EACH DAY  . moxifloxacin (VIGAMOX) 0.5 % ophthalmic solution Place 1 drop into the left eye daily.   . Multiple Vitamins tablet Take 1 tablet by mouth daily.   . vitamin B-12 (CYANOCOBALAMIN) 1000 MCG tablet Take 1,000 mcg by mouth daily.  . Calcium-Magnesium-Vitamin D (CALCIUM 500 PO) Take by mouth daily.   No facility-administered encounter medications on file as of 05/02/2018.     Activities of Daily Living In your present state of health, do you have any difficulty performing the following  activities: 05/02/2018  Hearing? Y  Comment Does ot wear hearing aids.   Vision? Y  Comment Loss of vision in left eye.  Difficulty concentrating or making decisions? Y  Walking or climbing stairs? N  Dressing or bathing? N  Doing errands, shopping? Y  Comment Does not drive.   Preparing Food and eating ? N  Using the Toilet? N  In the past six months, have you accidently leaked urine? Y  Comment Occasionally, wears protection daily.   Do you have problems with loss of bowel control? N  Managing your Medications? Y  Comment Daughter manages meds.   Managing your Finances? Y  Comment Daughter manages finances.  Housekeeping or managing your Housekeeping? N  Some recent data might be hidden    Patient Care Team: Jerrol Banana., MD as PCP - General (Family Medicine) Dingeldein, Remo Lipps, MD as Consulting Physician (Ophthalmology)    Assessment:   This is a routine wellness examination for [facility].  Exercise Activities and Dietary recommendations Current Exercise Habits: Home exercise routine, Type of exercise: walking;stretching, Time (Minutes): 30, Frequency (Times/Week): 6, Weekly Exercise (Minutes/Week): 180, Intensity: Mild  Goals    . Increase water intake     Recommend increasing water intake to 6 glasses a day.        Fall Risk Fall Risk  05/02/2018 04/26/2017 02/28/2017 07/22/2015  Falls in the past year? No Yes Yes No  Number falls in past yr: - 1 1 -  Injury with Fall? - No Yes -  Follow up - Falls prevention discussed - -   Is the patient's home free of loose throw rugs in walkways, pet beds, electrical cords, etc?   yes      Grab bars in the bathroom? yes      Handrails on the stairs?   no      Adequate lighting?   yes  Timed Get Up and Go performed: N/A  Depression Screen PHQ 2/9 Scores 05/02/2018 04/26/2017 02/28/2017 07/22/2015  PHQ - 2 Score 0 0 0 0  PHQ- 9 Score 5 2 0 13     Cognitive Function:      6CIT Screen 05/02/2018 04/26/2017  What Year? 0  points 0 points  What month? 0 points 0 points  What time? 0 points 0 points  Count back from 20 0 points 0 points  Months in reverse 4 points 4 points  Repeat phrase 10 points 8 points  Total Score 14 12    Immunization History  Administered Date(s) Administered  . Influenza, High Dose Seasonal PF 07/22/2015, 06/23/2016,  08/24/2017  . Pneumococcal Conjugate-13 06/04/2014  . Pneumococcal Polysaccharide-23 07/23/2006  . Td 12/27/2008  . Zoster 04/26/2012    Qualifies for Shingles Vaccine? Due for Shingles vaccine. Declined my offer to administer today. Education has been provided regarding the importance of this vaccine. Pt has been advised to call her insurance company to determine her out of pocket expense. Advised she may also receive this vaccine at her local pharmacy or Health Dept. Verbalized acceptance and understanding.  Screening Tests Health Maintenance  Topic Date Due  . INFLUENZA VACCINE  04/13/2018  . TETANUS/TDAP  12/28/2018  . DEXA SCAN  Completed  . PNA vac Low Risk Adult  Completed    Cancer Screenings: Lung: Low Dose CT Chest recommended if Age 28-80 years, 30 pack-year currently smoking OR have quit w/in 15years. Patient does not qualify. Breast:  Up to date on Mammogram? N/A   Up to date of Bone Density/Dexa? N/A Colorectal: N/A  Additional Screenings:  Hepatitis C Screening: N/A     Plan:  I have personally reviewed and addressed the Medicare Annual Wellness questionnaire and have noted the following in the patient's chart:  A. Medical and social history B. Use of alcohol, tobacco or illicit drugs  C. Current medications and supplements D. Functional ability and status E.  Nutritional status F.  Physical activity G. Advance directives H. List of other physicians I.  Hospitalizations, surgeries, and ER visits in previous 12 months J.  Aspers such as hearing and vision if needed, cognitive and depression L. Referrals and appointments  - none  In addition, I have reviewed and discussed with patient certain preventive protocols, quality metrics, and best practice recommendations. A written personalized care plan for preventive services as well as general preventive health recommendations were provided to patient.  See attached scanned questionnaire for additional information.   Signed,  Fabio Neighbors, LPN Nurse Health Advisor   Nurse Recommendations: None.

## 2018-05-02 NOTE — Patient Instructions (Addendum)
Ms. Lattner , Thank you for taking time to come for your Medicare Wellness Visit. I appreciate your ongoing commitment to your health goals. Please review the following plan we discussed and let me know if I can assist you in the future.   Screening recommendations/referrals: Colonoscopy: N/A Mammogram: N/A Bone Density: Up to date Recommended yearly ophthalmology/optometry visit for glaucoma screening and checkup Recommended yearly dental visit for hygiene and checkup  Vaccinations: Influenza vaccine: N/A Pneumococcal vaccine: Up to date Tdap vaccine: Up to date Shingles vaccine: Pt declines today.     Advanced directives: Advance directive discussed with you today. I have provided a copy for you to complete at home and have notarized. Once this is complete please bring a copy in to our office so we can scan it into your chart.  Conditions/risks identified: None.   Next appointment: 08/22/18 with Dr Dr Rosanna Randy. Pt declined scheduling the AWV for 2020.   Preventive Care 12 Years and Older, Female Preventive care refers to lifestyle choices and visits with your health care provider that can promote health and wellness. What does preventive care include?  A yearly physical exam. This is also called an annual well check.  Dental exams once or twice a year.  Routine eye exams. Ask your health care provider how often you should have your eyes checked.  Personal lifestyle choices, including:  Daily care of your teeth and gums.  Regular physical activity.  Eating a healthy diet.  Avoiding tobacco and drug use.  Limiting alcohol use.  Practicing safe sex.  Taking low-dose aspirin every day.  Taking vitamin and mineral supplements as recommended by your health care provider. What happens during an annual well check? The services and screenings done by your health care provider during your annual well check will depend on your age, overall health, lifestyle risk factors, and  family history of disease. Counseling  Your health care provider may ask you questions about your:  Alcohol use.  Tobacco use.  Drug use.  Emotional well-being.  Home and relationship well-being.  Sexual activity.  Eating habits.  History of falls.  Memory and ability to understand (cognition).  Work and work Statistician.  Reproductive health. Screening  You may have the following tests or measurements:  Height, weight, and BMI.  Blood pressure.  Lipid and cholesterol levels. These may be checked every 5 years, or more frequently if you are over 32 years old.  Skin check.  Lung cancer screening. You may have this screening every year starting at age 6 if you have a 30-pack-year history of smoking and currently smoke or have quit within the past 15 years.  Fecal occult blood test (FOBT) of the stool. You may have this test every year starting at age 103.  Flexible sigmoidoscopy or colonoscopy. You may have a sigmoidoscopy every 5 years or a colonoscopy every 10 years starting at age 44.  Hepatitis C blood test.  Hepatitis B blood test.  Sexually transmitted disease (STD) testing.  Diabetes screening. This is done by checking your blood sugar (glucose) after you have not eaten for a while (fasting). You may have this done every 1-3 years.  Bone density scan. This is done to screen for osteoporosis. You may have this done starting at age 15.  Mammogram. This may be done every 1-2 years. Talk to your health care provider about how often you should have regular mammograms. Talk with your health care provider about your test results, treatment options, and if necessary,  the need for more tests. Vaccines  Your health care provider may recommend certain vaccines, such as:  Influenza vaccine. This is recommended every year.  Tetanus, diphtheria, and acellular pertussis (Tdap, Td) vaccine. You may need a Td booster every 10 years.  Zoster vaccine. You may need this  after age 57.  Pneumococcal 13-valent conjugate (PCV13) vaccine. One dose is recommended after age 26.  Pneumococcal polysaccharide (PPSV23) vaccine. One dose is recommended after age 69. Talk to your health care provider about which screenings and vaccines you need and how often you need them. This information is not intended to replace advice given to you by your health care provider. Make sure you discuss any questions you have with your health care provider. Document Released: 09/26/2015 Document Revised: 05/19/2016 Document Reviewed: 07/01/2015 Elsevier Interactive Patient Education  2017 [place] Prevention in the Home Falls can cause injuries. They can happen to people of all ages. There are many things you can do to make your home safe and to help prevent falls. What can I do on the outside of my home?  Regularly fix the edges of walkways and driveways and fix any cracks.  Remove anything that might make you trip as you walk through a door, such as a raised step or threshold.  Trim any bushes or trees on the path to your home.  Use bright outdoor lighting.  Clear any walking paths of anything that might make someone trip, such as rocks or tools.  Regularly check to see if handrails are loose or broken. Make sure that both sides of any steps have handrails.  Any raised decks and porches should have guardrails on the edges.  Have any leaves, snow, or ice cleared regularly.  Use sand or salt on walking paths during winter.  Clean up any spills in your garage right away. This includes oil or grease spills. What can I do in the bathroom?  Use night lights.  Install grab bars by the toilet and in the tub and shower. Do not use towel bars as grab bars.  Use non-skid mats or decals in the tub or shower.  If you need to sit down in the shower, use a plastic, non-slip stool.  Keep the floor dry. Clean up any water that spills on the floor as soon as it  happens.  Remove soap buildup in the tub or shower regularly.  Attach bath mats securely with double-sided non-slip rug tape.  Do not have throw rugs and other things on the floor that can make you trip. What can I do in the bedroom?  Use night lights.  Make sure that you have a light by your bed that is easy to reach.  Do not use any sheets or blankets that are too big for your bed. They should not hang down onto the floor.  Have a firm chair that has side arms. You can use this for support while you get dressed.  Do not have throw rugs and other things on the floor that can make you trip. What can I do in the kitchen?  Clean up any spills right away.  Avoid walking on wet floors.  Keep items that you use a lot in easy-to-reach places.  If you need to reach something above you, use a strong step stool that has a grab bar.  Keep electrical cords out of the way.  Do not use floor polish or wax that makes floors slippery. If you must use  wax, use non-skid floor wax.  Do not have throw rugs and other things on the floor that can make you trip. What can I do with my stairs?  Do not leave any items on the stairs.  Make sure that there are handrails on both sides of the stairs and use them. Fix handrails that are broken or loose. Make sure that handrails are as long as the stairways.  Check any carpeting to make sure that it is firmly attached to the stairs. Fix any carpet that is loose or worn.  Avoid having throw rugs at the top or bottom of the stairs. If you do have throw rugs, attach them to the floor with carpet tape.  Make sure that you have a light switch at the top of the stairs and the bottom of the stairs. If you do not have them, ask someone to add them for you. What else can I do to help prevent falls?  Wear shoes that:  Do not have high heels.  Have rubber bottoms.  Are comfortable and fit you well.  Are closed at the toe. Do not wear sandals.  If you  use a stepladder:  Make sure that it is fully opened. Do not climb a closed stepladder.  Make sure that both sides of the stepladder are locked into place.  Ask someone to hold it for you, if possible.  Clearly mark and make sure that you can see:  Any grab bars or handrails.  First and last steps.  Where the edge of each step is.  Use tools that help you move around (mobility aids) if they are needed. These include:  Canes.  Walkers.  Scooters.  Crutches.  Turn on the lights when you go into a dark area. Replace any light bulbs as soon as they burn out.  Set up your furniture so you have a clear path. Avoid moving your furniture around.  If any of your floors are uneven, fix them.  If there are any pets around you, be aware of where they are.  Review your medicines with your doctor. Some medicines can make you feel dizzy. This can increase your chance of falling. Ask your doctor what other things that you can do to help prevent falls. This information is not intended to replace advice given to you by your health care provider. Make sure you discuss any questions you have with your health care provider. Document Released: 06/26/2009 Document Revised: 02/05/2016 Document Reviewed: 10/04/2014 Elsevier Interactive Patient Education  2017 [employer].

## 2018-05-08 DIAGNOSIS — H16002 Unspecified corneal ulcer, left eye: Secondary | ICD-10-CM | POA: Diagnosis not present

## 2018-06-08 DIAGNOSIS — H16002 Unspecified corneal ulcer, left eye: Secondary | ICD-10-CM | POA: Diagnosis not present

## 2018-06-21 ENCOUNTER — Ambulatory Visit (INDEPENDENT_AMBULATORY_CARE_PROVIDER_SITE_OTHER): Payer: PPO

## 2018-06-21 DIAGNOSIS — Z23 Encounter for immunization: Secondary | ICD-10-CM

## 2018-07-07 ENCOUNTER — Telehealth: Payer: Self-pay | Admitting: Family Medicine

## 2018-07-07 NOTE — Telephone Encounter (Signed)
[name], pt daughter, wants to know if [name] is a candidate for shingerix?? [name] is 84 years old.

## 2018-07-11 DIAGNOSIS — H18422 Band keratopathy, left eye: Secondary | ICD-10-CM | POA: Diagnosis not present

## 2018-07-11 NOTE — Telephone Encounter (Signed)
Discussed with the daughter.

## 2018-08-22 ENCOUNTER — Ambulatory Visit: Payer: Self-pay | Admitting: Family Medicine

## 2018-08-23 ENCOUNTER — Ambulatory Visit (INDEPENDENT_AMBULATORY_CARE_PROVIDER_SITE_OTHER): Payer: PPO | Admitting: Family Medicine

## 2018-08-23 ENCOUNTER — Encounter: Payer: Self-pay | Admitting: Family Medicine

## 2018-08-23 VITALS — BP 110/72 | HR 77 | Temp 97.6°F | Resp 15 | Wt 141.8 lb

## 2018-08-23 DIAGNOSIS — R739 Hyperglycemia, unspecified: Secondary | ICD-10-CM

## 2018-08-23 DIAGNOSIS — H18422 Band keratopathy, left eye: Secondary | ICD-10-CM | POA: Diagnosis not present

## 2018-08-23 DIAGNOSIS — I10 Essential (primary) hypertension: Secondary | ICD-10-CM | POA: Diagnosis not present

## 2018-08-23 DIAGNOSIS — E039 Hypothyroidism, unspecified: Secondary | ICD-10-CM

## 2018-08-23 DIAGNOSIS — E782 Mixed hyperlipidemia: Secondary | ICD-10-CM

## 2018-08-23 NOTE — Progress Notes (Signed)
Patient: Felicia Weaver Female    DOB: 1925/01/27   84 y.o.   MRN: [id number] Visit Date: 08/23/2018  Today's Provider: [name], MD   Chief Complaint  Patient presents with  . Hypertension  . Hyperlipidemia  . Hyperglycemia  . Hyperthyroidism   Subjective:    HPI      Hypertension, follow-up:  BP Readings from Last 3 Encounters:  08/23/18 110/72  05/02/18 (!) 142/62  02/15/18 (!) 128/58    She was last seen for hypertension 6 months ago.  BP at that visit was 128/58. Management changes since that visit include none. She reports excellent compliance with treatment. She is not having side effects.  She is exercising. She is not adherent to low salt diet.   Outside blood pressures are not being checked. She is experiencing none.  Patient denies chest pain, chest pressure/discomfort, claudication, dyspnea, exertional chest pressure/discomfort, fatigue, irregular heart beat, lower extremity edema, near-syncope, orthopnea, palpitations, paroxysmal nocturnal dyspnea, syncope and tachypnea.   Cardiovascular risk factors include advanced age (older than 90 for men, 26 for women) and hypertension.  Use of agents associated with hypertension: none.     Weight trend: stable Wt Readings from Last 3 Encounters:  08/23/18 141 lb 12.8 oz (64.3 kg)  05/02/18 140 lb 12.8 oz (63.9 kg)  02/15/18 137 lb (62.1 kg)    Current diet: in general, a "healthy" diet    ------------------------------------------------------------------------  Lipid/Cholesterol, Follow-up:   Last seen for this 6  months ago.  Management changes since that visit include none. . Last Lipid Panel:    Component Value Date/Time   CHOL 227 (H) 09/03/2016 0854   TRIG 91 09/03/2016 0854   HDL 72 09/03/2016 0854   LDLCALC 137 (H) 09/03/2016 0854    Risk factors for vascular disease include hypercholesterolemia and hypertension   Current symptoms include paresthesia of the feet and have  been unchanged. Weight trend: stable Prior visit with dietician: no Current diet: in general, a "healthy" diet   Current exercise: walking  Wt Readings from Last 3 Encounters:  08/23/18 141 lb 12.8 oz (64.3 kg)  05/02/18 140 lb 12.8 oz (63.9 kg)  02/15/18 137 lb (62.1 kg)    -------------------------------------------------------------------  Hypothyroid, follow-up:  TSH  Date Value Ref Range Status  07/25/2017 1.67 0.40 - 4.50 mIU/L Final  09/03/2016 2.870 0.450 - 4.500 uIU/mL Final  12/01/2015 1.720 0.450 - 4.500 uIU/mL Final   Wt Readings from Last 3 Encounters:  08/23/18 141 lb 12.8 oz (64.3 kg)  05/02/18 140 lb 12.8 oz (63.9 kg)  02/15/18 137 lb (62.1 kg)    She was last seen for hypothyroid 6 months ago.  Management since that visit includes none.  She is experiencing no change of symptoms She denies change in energy level, diarrhea, heat / cold intolerance, nervousness, palpitations and weight changes Weight trend: stable  ------------------------------------------------------------------------  Prediabetes, Follow-up:   Lab Results  Component Value Date   HGBA1C 5.9 (H) 02/15/2018   HGBA1C 5.8 (H) 07/25/2017   HGBA1C 5.9 02/28/2017   GLUCOSE 113 (H) 02/15/2018   GLUCOSE 144 (H) 07/25/2017   GLUCOSE 102 (H) 09/03/2016    Last seen for for this 6 months ago.  Management since that visit includes none. Current symptoms include none and have been unchanged.  Weight trend: stable Prior visit with dietician: no Current diet: in general, a "healthy" diet   Current exercise: walking  Pertinent Labs:    Component  Value Date/Time   CHOL 227 (H) 09/03/2016 0854   TRIG 91 09/03/2016 0854   CREATININE 1.76 (H) 02/15/2018 1419   CREATININE 1.99 (H) 07/25/2017 1414    Wt Readings from Last 3 Encounters:  08/23/18 141 lb 12.8 oz (64.3 kg)  05/02/18 140 lb 12.8 oz (63.9 kg)  02/15/18 137 lb (62.1 kg)     Allergies  Allergen Reactions  . Betagan   [Levobunolol Hcl] Other (See Comments)    Not Assessed  . Brimonidine Tartrate Other (See Comments)    Not Assessed Itching eyes  . Other Other (See Comments)    PROPINE. PROPINE -itching eyes  . Sulfa Antibiotics Nausea Only     Current Outpatient Medications:  .  aspirin 81 MG tablet, Take 81 mg by mouth daily. , Disp: , Rfl:  .  hydrochlorothiazide (HYDRODIURIL) 25 MG tablet, TAKE ONE (1) TABLET EACH DAY, Disp: 90 tablet, Rfl: 2 .  moxifloxacin (VIGAMOX) 0.5 % ophthalmic solution, Place 1 drop into the left eye daily. , Disp: , Rfl:  .  Multiple Vitamins tablet, Take 1 tablet by mouth daily. , Disp: , Rfl:  .  vitamin B-12 (CYANOCOBALAMIN) 1000 MCG tablet, Take 1,000 mcg by mouth daily., Disp: , Rfl:   Review of Systems  Constitutional: Negative.   HENT: Negative.   Eyes: Negative.   Respiratory: Negative.   Cardiovascular: Negative.   Gastrointestinal: Negative.   Endocrine: Negative.   Genitourinary: Negative.   Musculoskeletal: Positive for myalgias (leg cramps).  Skin: Negative.   Allergic/Immunologic: Negative.   Neurological: Negative.   Hematological: Negative.   Psychiatric/Behavioral: Negative.     Social History   Tobacco Use  . Smoking status: Never Smoker  . Smokeless tobacco: Never Used  Substance Use Topics  . Alcohol use: No   Objective:   BP 110/72   Pulse 77   Temp 97.6 F (36.4 C) (Oral)   Resp 15   Wt 141 lb 12.8 oz (64.3 kg)   BMI 25.12 kg/m  Vitals:   08/23/18 1539  BP: 110/72  Pulse: 77  Resp: 15  Temp: 97.6 F (36.4 C)  TempSrc: Oral  Weight: 141 lb 12.8 oz (64.3 kg)     Physical Exam Constitutional:      Appearance: She is well-developed.  HENT:     Head: Normocephalic and atraumatic.  Eyes:     Conjunctiva/sclera: Conjunctivae normal.     Pupils: Pupils are equal, round, and reactive to light.  Neck:     Musculoskeletal: Normal range of motion and neck supple.  Cardiovascular:     Rate and Rhythm: Normal rate and  regular rhythm.     Heart sounds: Normal heart sounds.  Pulmonary:     Effort: Pulmonary effort is normal.     Breath sounds: Normal breath sounds.  Abdominal:     Palpations: Abdomen is soft.  Musculoskeletal: Normal range of motion.  Skin:    General: Skin is warm and dry.  Neurological:     Mental Status: She is alert and oriented to person, place, and time.     Deep Tendon Reflexes: Reflexes are normal and symmetric.  Psychiatric:        Behavior: Behavior normal.        Thought Content: Thought content normal.        Judgment: Judgment normal.         Assessment & Plan:     1. Essential hypertension More than 50% og 25 minute visit spent counselling.coordination  of care. 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 Blood glucose elevated   4. Hyperglycemia  - HgB A1c  5. Adult hypothyroidism  - TSH  6. Hypercalcemia - PTH, Intact and Calcium Discussed what this might mean if level goes up.     Patient seen and examined by Dr. Miguel Aschoff, note scribed by Jennings Books, [place], MD  [place]

## 2018-08-24 LAB — COMPREHENSIVE METABOLIC PANEL
ALK PHOS: 75 IU/L (ref 39–117)
ALT: 18 IU/L (ref 0–32)
AST: 21 IU/L (ref 0–40)
Albumin/Globulin Ratio: 2 (ref 1.2–2.2)
Albumin: 4.5 g/dL (ref 3.2–4.6)
BUN/Creatinine Ratio: 15 (ref 12–28)
BUN: 22 mg/dL (ref 10–36)
Bilirubin Total: 0.3 mg/dL (ref 0.0–1.2)
CALCIUM: 10.7 mg/dL — AB (ref 8.7–10.3)
CO2: 29 mmol/L (ref 20–29)
Chloride: 95 mmol/L — ABNORMAL LOW (ref 96–106)
Creatinine, Ser: 1.46 mg/dL — ABNORMAL HIGH (ref 0.57–1.00)
GFR calc Af Amer: 36 mL/min/{1.73_m2} — ABNORMAL LOW (ref >59)
GFR, EST NON AFRICAN AMERICAN: 31 mL/min/{1.73_m2} — AB (ref >59)
GLUCOSE: 91 mg/dL (ref 65–99)
Globulin, Total: 2.2 g/dL (ref 1.5–4.5)
Potassium: 4.3 mmol/L (ref 3.5–5.2)
Sodium: 138 mmol/L (ref 134–144)
TOTAL PROTEIN: 6.7 g/dL (ref 6.0–8.5)

## 2018-08-24 LAB — CBC WITH DIFFERENTIAL/PLATELET
BASOS: 1 %
Basophils Absolute: 0.1 10*3/uL (ref 0.0–0.2)
EOS (ABSOLUTE): 0.1 10*3/uL (ref 0.0–0.4)
Eos: 1 %
Hematocrit: 39 % (ref 34.0–46.6)
Hemoglobin: 13.1 g/dL (ref 11.1–15.9)
Immature Grans (Abs): 0 10*3/uL (ref 0.0–0.1)
Immature Granulocytes: 0 %
Lymphocytes Absolute: 1.3 10*3/uL (ref 0.7–3.1)
Lymphs: 24 %
MCH: 30.8 pg (ref 26.6–33.0)
MCHC: 33.6 g/dL (ref 31.5–35.7)
MCV: 92 fL (ref 79–97)
MONOCYTES: 10 %
Monocytes Absolute: 0.5 10*3/uL (ref 0.1–0.9)
Neutrophils Absolute: 3.7 10*3/uL (ref 1.4–7.0)
Neutrophils: 64 %
Platelets: 205 10*3/uL (ref 150–450)
RBC: 4.25 x10E6/uL (ref 3.77–5.28)
RDW: 11.8 % — ABNORMAL LOW (ref 12.3–15.4)
WBC: 5.7 10*3/uL (ref 3.4–10.8)

## 2018-08-24 LAB — LIPID PANEL
Chol/HDL Ratio: 3 ratio (ref 0.0–4.4)
Cholesterol, Total: 230 mg/dL — ABNORMAL HIGH (ref 100–199)
HDL: 76 mg/dL (ref >39)
LDL Calculated: 134 mg/dL — ABNORMAL HIGH (ref 0–99)
Triglycerides: 99 mg/dL (ref 0–149)
VLDL Cholesterol Cal: 20 mg/dL (ref 5–40)

## 2018-08-24 LAB — HEMOGLOBIN A1C
Est. average glucose Bld gHb Est-mCnc: 126 mg/dL
Hgb A1c MFr Bld: 6 % — ABNORMAL HIGH (ref 4.8–5.6)

## 2018-08-24 LAB — TSH: TSH: 1.77 u[IU]/mL (ref 0.450–4.500)

## 2018-08-24 LAB — PTH, INTACT AND CALCIUM: PTH: 26 pg/mL (ref 15–65)

## 2018-08-28 ENCOUNTER — Telehealth: Payer: Self-pay

## 2018-08-28 NOTE — Telephone Encounter (Signed)
Left message to call back  

## 2018-08-28 NOTE — Telephone Encounter (Signed)
-----   Message from Jerrol Banana., MD sent at 08/25/2018  8:53 AM EST ----- Labs stable.  Calcium slightly high.  Stop any calcium supplements and start vitamin D daily if not taking.  Can order a calcium work-up now or wait until next visit.  I think it is all right to wait until next visit

## 2018-08-31 NOTE — Telephone Encounter (Signed)
Left message to call back patients daughter [name] sally).

## 2018-08-31 NOTE — Telephone Encounter (Signed)
Advised patient's daughter of results.

## 2018-09-26 DIAGNOSIS — H18422 Band keratopathy, left eye: Secondary | ICD-10-CM | POA: Diagnosis not present

## 2018-10-25 DIAGNOSIS — H401132 Primary open-angle glaucoma, bilateral, moderate stage: Secondary | ICD-10-CM | POA: Diagnosis not present

## 2018-10-25 DIAGNOSIS — H18422 Band keratopathy, left eye: Secondary | ICD-10-CM | POA: Diagnosis not present

## 2018-11-29 ENCOUNTER — Other Ambulatory Visit: Payer: Self-pay | Admitting: Family Medicine

## 2018-11-29 MED ORDER — HYDROCHLOROTHIAZIDE 25 MG PO TABS: ORAL_TABLET | ORAL | 2 refills | Status: DC

## 2018-11-29 NOTE — Telephone Encounter (Signed)
[facility] faxed refill request for the following medications:   hydrochlorothiazide (HYDRODIURIL) 25 MG tablet    Please advise.  

## 2019-01-09 DIAGNOSIS — H16002 Unspecified corneal ulcer, left eye: Secondary | ICD-10-CM | POA: Diagnosis not present

## 2019-02-22 ENCOUNTER — Ambulatory Visit: Payer: Self-pay | Admitting: Family Medicine

## 2019-03-12 ENCOUNTER — Ambulatory Visit: Payer: Self-pay | Admitting: Family Medicine

## 2019-03-13 ENCOUNTER — Other Ambulatory Visit: Payer: Self-pay

## 2019-03-13 ENCOUNTER — Encounter: Payer: Self-pay | Admitting: Family Medicine

## 2019-03-13 ENCOUNTER — Ambulatory Visit (INDEPENDENT_AMBULATORY_CARE_PROVIDER_SITE_OTHER): Payer: PPO | Admitting: Family Medicine

## 2019-03-13 VITALS — BP 136/58 | HR 58 | Temp 98.0°F | Resp 16 | Wt 134.4 lb

## 2019-03-13 DIAGNOSIS — T148XXA Other injury of unspecified body region, initial encounter: Secondary | ICD-10-CM | POA: Diagnosis not present

## 2019-03-13 DIAGNOSIS — R739 Hyperglycemia, unspecified: Secondary | ICD-10-CM

## 2019-03-13 DIAGNOSIS — E782 Mixed hyperlipidemia: Secondary | ICD-10-CM | POA: Diagnosis not present

## 2019-03-13 DIAGNOSIS — D692 Other nonthrombocytopenic purpura: Secondary | ICD-10-CM

## 2019-03-13 DIAGNOSIS — E039 Hypothyroidism, unspecified: Secondary | ICD-10-CM | POA: Diagnosis not present

## 2019-03-13 DIAGNOSIS — F5101 Primary insomnia: Secondary | ICD-10-CM | POA: Diagnosis not present

## 2019-03-13 DIAGNOSIS — I10 Essential (primary) hypertension: Secondary | ICD-10-CM

## 2019-03-13 DIAGNOSIS — H16002 Unspecified corneal ulcer, left eye: Secondary | ICD-10-CM | POA: Diagnosis not present

## 2019-03-13 NOTE — Progress Notes (Signed)
Patient: Felicia Weaver Female    DOB: [birth year]   85 y.o.   MRN: [id number] Visit Date: 03/13/2019  Today's Provider: [name], MD   Chief Complaint  Patient presents with  . Hypertension  . Hyperlipidemia  . Hypothyroidism  . Hyperglycemia   Subjective:     HPI  .  She has no complaints.  She feels well overall.  She still lives independently in her apartment.  Hypertension, follow-up:  BP Readings from Last 3 Encounters:  03/13/19 (!) 136/58  08/23/18 110/72  05/02/18 (!) 142/62    She was last seen for hypertension 6 months ago.  BP at that visit was 110/72. Management changes since that visit include none. She reports excellent compliance with treatment. She is not having side effects.  She is exercising. She is not adherent to low salt diet.   Outside blood pressures are not being checked regularly . She is experiencing none.  Patient denies chest pain, chest pressure/discomfort, claudication, dyspnea, exertional chest pressure/discomfort, fatigue, irregular heart beat, lower extremity edema, near-syncope, orthopnea, palpitations, paroxysmal nocturnal dyspnea, syncope and tachypnea.   Cardiovascular risk factors include advanced age (older than 48 for men, 35 for women) and hypertension.  Use of agents associated with hypertension: NSAIDS.     Weight trend: stable Wt Readings from Last 3 Encounters:  03/13/19 134 lb 6.4 oz (61 kg)  08/23/18 141 lb 12.8 oz (64.3 kg)  05/02/18 140 lb 12.8 oz (63.9 kg)    Current diet: in general, a "healthy" diet    ------------------------------------------------------------------------  Lipid/Cholesterol, Follow-up:   Last seen for this6 months ago.  Management changes since that visit include none. . Last Lipid Panel:    Component Value Date/Time   CHOL 230 (H) 08/23/2018 1628   TRIG 99 08/23/2018 1628   HDL 76 08/23/2018 1628   CHOLHDL 3.0 08/23/2018 1628   LDLCALC 134 (H) 08/23/2018 1628     Risk factors for vascular disease include hypercholesterolemia and hypertension Weight trend: stable Prior visit with dietician: no Current diet: in general, a "healthy" diet   Current exercise: walking  Wt Readings from Last 3 Encounters:  03/13/19 134 lb 6.4 oz (61 kg)  08/23/18 141 lb 12.8 oz (64.3 kg)  05/02/18 140 lb 12.8 oz (63.9 kg)    -------------------------------------------------------------------  Hypothyroid, follow-up:  TSH  Date Value Ref Range Status  08/23/2018 1.770 0.450 - 4.500 uIU/mL Final  07/25/2017 1.67 0.40 - 4.50 mIU/L Final  09/03/2016 2.870 0.450 - 4.500 uIU/mL Final   Wt Readings from Last 3 Encounters:  03/13/19 134 lb 6.4 oz (61 kg)  08/23/18 141 lb 12.8 oz (64.3 kg)  05/02/18 140 lb 12.8 oz (63.9 kg)    She was last seen for hypothyroid 6 months ago.  Management since that visit includes none.  She is experiencing none She denies change in energy level, diarrhea, heat / cold intolerance, nervousness, palpitations and weight changes Weight trend: stable  ------------------------------------------------------------------------  Prediabetes, Follow-up:   Lab Results  Component Value Date   HGBA1C 6.0 (H) 08/23/2018   HGBA1C 5.9 (H) 02/15/2018   HGBA1C 5.8 (H) 07/25/2017   GLUCOSE 91 08/23/2018   GLUCOSE 113 (H) 02/15/2018   GLUCOSE 144 (H) 07/25/2017    Last seen for for this6 months ago.  Management since that visit includes none. Current symptoms include none and have been unchanged.  Weight trend: stable Prior visit with dietician: no Current diet: in general, a "healthy" diet  Current exercise: walking  Pertinent Labs:    Component Value Date/Time   CHOL 230 (H) 08/23/2018 1628   TRIG 99 08/23/2018 1628   CHOLHDL 3.0 08/23/2018 1628   CREATININE 1.46 (H) 08/23/2018 1628   CREATININE 1.99 (H) 07/25/2017 1414    Wt Readings from Last 3 Encounters:  03/13/19 134 lb 6.4 oz (61 kg)  08/23/18 141 lb 12.8 oz (64.3 kg)   05/02/18 140 lb 12.8 oz (63.9 kg)     Allergies  Allergen Reactions  . Betagan  [Levobunolol Hcl] Other (See Comments)    Not Assessed  . Brimonidine Tartrate Other (See Comments)    Not Assessed Itching eyes  . Other Other (See Comments)    PROPINE. PROPINE -itching eyes  . Sulfa Antibiotics Nausea Only     Current Outpatient Medications:  .  aspirin 81 MG tablet, Take 81 mg by mouth daily. , Disp: , Rfl:  .  hydrochlorothiazide (HYDRODIURIL) 25 MG tablet, TAKE ONE (1) TABLET EACH DAY, Disp: 90 tablet, Rfl: 2 .  moxifloxacin (VIGAMOX) 0.5 % ophthalmic solution, Place 1 drop into the left eye daily. , Disp: , Rfl:  .  Multiple Vitamins tablet, Take 1 tablet by mouth daily. , Disp: , Rfl:  .  vitamin B-12 (CYANOCOBALAMIN) 1000 MCG tablet, Take 1,000 mcg by mouth daily., Disp: , Rfl:   Review of Systems  Constitutional: Negative.   HENT: Positive for hearing loss.   Eyes: Positive for visual disturbance.       Mostly blind left eye.  Respiratory: Negative.   Cardiovascular: Negative.   Gastrointestinal: Negative.   Endocrine: Negative.   Allergic/Immunologic: Negative.   Neurological: Negative.   Hematological: Bruises/bleeds easily.  Psychiatric/Behavioral: Negative.     Social History   Tobacco Use  . Smoking status: Never Smoker  . Smokeless tobacco: Never Used  Substance Use Topics  . Alcohol use: No      Objective:   BP (!) 136/58   Pulse (!) 58   Temp 98 F (36.7 C) (Oral)   Resp 16   Wt 134 lb 6.4 oz (61 kg)   BMI 23.81 kg/m  Vitals:   03/13/19 1605  BP: (!) 136/58  Pulse: (!) 58  Resp: 16  Temp: 98 F (36.7 C)  TempSrc: Oral  Weight: 134 lb 6.4 oz (61 kg)     Physical Exam Vitals signs reviewed.  Constitutional:      Appearance: Normal appearance. She is well-developed.     Comments: Thin WF appears younger than her age.  HENT:     Head: Normocephalic and atraumatic.  Eyes:     Conjunctiva/sclera: Conjunctivae normal.     Pupils:  Pupils are equal, round, and reactive to light.  Neck:     Musculoskeletal: Normal range of motion and neck supple.  Cardiovascular:     Rate and Rhythm: Normal rate and regular rhythm.     Heart sounds: Normal heart sounds.  Pulmonary:     Effort: Pulmonary effort is normal.     Breath sounds: Normal breath sounds.  Abdominal:     Palpations: Abdomen is soft.  Musculoskeletal: Normal range of motion.  Skin:    General: Skin is warm and dry.     Comments: Fair skin--bruising of forearms and hands.  Neurological:     Mental Status: She is alert and oriented to person, place, and time.     Deep Tendon Reflexes: Reflexes are normal and symmetric.  Psychiatric:  Mood and Affect: Mood normal.        Behavior: Behavior normal.        Thought Content: Thought content normal.        Judgment: Judgment normal.      No results found for any visits on 03/13/19.     Assessment & Plan    1. Adult hypothyroidism  - TSH  2. Hyperglycemia  - Hemoglobin A1c - CBC with Differential/Platelet  3. Essential hypertension Controlled on HCTZ. - Comprehensive metabolic panel  4. Hypercalcemia F/u as clinically indicated. - PTH, Intact and Calcium  5. Combined fat and carbohydrate induced hyperlipemia Will probably not treat or check again in 85 yo. - Lipid panel 6.Bruising Only forearms and hands--normal aging process--early senile purpura 7.Insomnia Try melatonin.    Richard Cranford Mon, MD  [facility] Fritzi Mandes [place],acting as a scribe for [name], MD.,have documented all relevant documentation on the behalf of [name], MD,as directed by  [name], MD while in the presence of [name], MD.

## 2019-03-13 NOTE — Patient Instructions (Signed)
Try over the counter melatonin 5 mg

## 2019-04-16 DIAGNOSIS — M7541 Impingement syndrome of right shoulder: Secondary | ICD-10-CM | POA: Diagnosis not present

## 2019-04-16 DIAGNOSIS — M7540 Impingement syndrome of unspecified shoulder: Secondary | ICD-10-CM | POA: Insufficient documentation

## 2019-04-16 DIAGNOSIS — H179 Unspecified corneal scar and opacity: Secondary | ICD-10-CM | POA: Diagnosis not present

## 2019-05-09 ENCOUNTER — Other Ambulatory Visit: Payer: Self-pay | Admitting: Family Medicine

## 2019-05-09 DIAGNOSIS — L570 Actinic keratosis: Secondary | ICD-10-CM | POA: Diagnosis not present

## 2019-05-09 DIAGNOSIS — I872 Venous insufficiency (chronic) (peripheral): Secondary | ICD-10-CM | POA: Diagnosis not present

## 2019-05-09 DIAGNOSIS — L578 Other skin changes due to chronic exposure to nonionizing radiation: Secondary | ICD-10-CM | POA: Diagnosis not present

## 2019-05-16 DIAGNOSIS — R739 Hyperglycemia, unspecified: Secondary | ICD-10-CM | POA: Diagnosis not present

## 2019-05-16 DIAGNOSIS — E782 Mixed hyperlipidemia: Secondary | ICD-10-CM | POA: Diagnosis not present

## 2019-05-16 DIAGNOSIS — E039 Hypothyroidism, unspecified: Secondary | ICD-10-CM | POA: Diagnosis not present

## 2019-05-16 DIAGNOSIS — I10 Essential (primary) hypertension: Secondary | ICD-10-CM | POA: Diagnosis not present

## 2019-05-17 ENCOUNTER — Telehealth: Payer: Self-pay

## 2019-05-17 LAB — CBC WITH DIFFERENTIAL/PLATELET
Basophils Absolute: 0 x10E3/uL (ref 0.0–0.2)
Basos: 1 %
EOS (ABSOLUTE): 0.1 x10E3/uL (ref 0.0–0.4)
Eos: 2 %
Hematocrit: 40.6 % (ref 34.0–46.6)
Hemoglobin: 13.2 g/dL (ref 11.1–15.9)
Immature Grans (Abs): 0 x10E3/uL (ref 0.0–0.1)
Immature Granulocytes: 0 %
Lymphocytes Absolute: 1.3 x10E3/uL (ref 0.7–3.1)
Lymphs: 24 %
MCH: 30.9 pg (ref 26.6–33.0)
MCHC: 32.5 g/dL (ref 31.5–35.7)
MCV: 95 fL (ref 79–97)
Monocytes Absolute: 0.5 x10E3/uL (ref 0.1–0.9)
Monocytes: 9 %
Neutrophils Absolute: 3.5 x10E3/uL (ref 1.4–7.0)
Neutrophils: 64 %
Platelets: 165 x10E3/uL (ref 150–450)
RBC: 4.27 x10E6/uL (ref 3.77–5.28)
RDW: 12.6 % (ref 11.7–15.4)
WBC: 5.4 x10E3/uL (ref 3.4–10.8)

## 2019-05-17 LAB — COMPREHENSIVE METABOLIC PANEL WITH GFR
ALT: 14 IU/L (ref 0–32)
AST: 20 IU/L (ref 0–40)
Albumin/Globulin Ratio: 2 (ref 1.2–2.2)
Albumin: 4.3 g/dL (ref 3.5–4.6)
Alkaline Phosphatase: 61 IU/L (ref 39–117)
BUN/Creatinine Ratio: 15 (ref 12–28)
BUN: 24 mg/dL (ref 10–36)
Bilirubin Total: 0.3 mg/dL (ref 0.0–1.2)
CO2: 28 mmol/L (ref 20–29)
Calcium: 10.7 mg/dL — ABNORMAL HIGH (ref 8.7–10.3)
Chloride: 101 mmol/L (ref 96–106)
Creatinine, Ser: 1.6 mg/dL — ABNORMAL HIGH (ref 0.57–1.00)
GFR calc Af Amer: 32 mL/min/1.73 — ABNORMAL LOW (ref >59)
GFR calc non Af Amer: 28 mL/min/1.73 — ABNORMAL LOW (ref >59)
Globulin, Total: 2.2 g/dL (ref 1.5–4.5)
Glucose: 92 mg/dL (ref 65–99)
Potassium: 4.1 mmol/L (ref 3.5–5.2)
Sodium: 141 mmol/L (ref 134–144)
Total Protein: 6.5 g/dL (ref 6.0–8.5)

## 2019-05-17 LAB — PTH, INTACT AND CALCIUM: PTH: 29 pg/mL (ref 15–65)

## 2019-05-17 LAB — HEMOGLOBIN A1C
Est. average glucose Bld gHb Est-mCnc: 123 mg/dL
Hgb A1c MFr Bld: 5.9 % — ABNORMAL HIGH (ref 4.8–5.6)

## 2019-05-17 LAB — LIPID PANEL
Chol/HDL Ratio: 3.3 ratio (ref 0.0–4.4)
Cholesterol, Total: 225 mg/dL — ABNORMAL HIGH (ref 100–199)
HDL: 68 mg/dL (ref >39)
LDL Chol Calc (NIH): 142 mg/dL — ABNORMAL HIGH (ref 0–99)
Triglycerides: 88 mg/dL (ref 0–149)
VLDL Cholesterol Cal: 15 mg/dL (ref 5–40)

## 2019-05-17 LAB — TSH: TSH: 3.28 u[IU]/mL (ref 0.450–4.500)

## 2019-05-17 NOTE — Telephone Encounter (Signed)
-----   Message from Jerrol Banana., MD sent at 05/17/2019  7:41 AM EDT ----- Labs stable--stop any calcium supplements.

## 2019-05-17 NOTE — Telephone Encounter (Signed)
Patient stopped taking the calcium 8 months ago.    Per her daughter [name], the patient told her (after her visit in June) that she had been having black stool.  No mention of blood and her CBC was normal.  [name] states she just wants to watch it at this point but will let us know if she see any blood.

## 2019-06-18 DIAGNOSIS — H179 Unspecified corneal scar and opacity: Secondary | ICD-10-CM | POA: Diagnosis not present

## 2019-08-03 ENCOUNTER — Other Ambulatory Visit: Payer: Self-pay

## 2019-08-07 NOTE — Progress Notes (Signed)
Patient: Felicia Weaver Female    DOB: Jun 07, 1925   85 y.o.   MRN: [id number] Visit Date: 08/13/2019  Today's Provider: [name], MD   Chief Complaint  Patient presents with  . Follow-up  . Hypothyroidism  . Hypertension   Subjective:     HPI  Patient overall feels well.  She is starting to get frustrated and little sad due to isolation due to Covid pandemic. Her PHQ 2 score is 2 today but her PHQ-9 is 13. Adult hypothyroidism From 03/13/2019-labs checked 05/16/2019. Labs stable. Advised to stop any calcium supplements.  Hyperglycemia From 03/13/2019-labs checked 05/16/2019. Labs stable. Hemoglobin A1c 5.9.   Essential hypertension From 03/13/2019-Controlled on HCTZ. Labs checked 05/16/2019. Labs stable. Advised to stop any calcium supplements.  Hypercalcemia From 03/13/2019-F/u as clinically indicated. Labs checked 05/16/2019. Labs stable. Advised to stop any calcium supplements.  Combined fat and carbohydrate induced hyperlipemia From 03/13/2019-Will probably not treat or check again in 85 yo. From 03/13/2019-labs checked 05/16/2019. Labs stable.   Bruising From 03/13/2019-Only forearms and hands--normal aging process--early senile purpura  Insomnia From 03/13/2019-Try melatonin.   Allergies  Allergen Reactions  . Betagan  [Levobunolol Hcl] Other (See Comments)    Not Assessed  . Brimonidine Tartrate Other (See Comments)    Not Assessed Itching eyes  . Other Other (See Comments)    PROPINE. PROPINE -itching eyes  . Sulfa Antibiotics Nausea Only     Current Outpatient Medications:  .  aspirin 81 MG tablet, Take 81 mg by mouth daily. , Disp: , Rfl:  .  hydrochlorothiazide (HYDRODIURIL) 25 MG tablet, TAKE ONE TABLET EVERY DAY, Disp: 90 tablet, Rfl: 2 .  MAGNESIUM PO, Take by mouth., Disp: , Rfl:  .  Melatonin-Pyridoxine (MELATIN PO), Take by mouth., Disp: , Rfl:  .  moxifloxacin (VIGAMOX) 0.5 % ophthalmic solution, Place 1 drop into the left eye daily.  , Disp: , Rfl:  .  Multiple Vitamins tablet, Take 1 tablet by mouth daily. , Disp: , Rfl:  .  triamcinolone cream (KENALOG) 0.1 %, , Disp: , Rfl:  .  vitamin B-12 (CYANOCOBALAMIN) 1000 MCG tablet, Take 1,000 mcg by mouth daily., Disp: , Rfl:   Review of Systems  Constitutional: Negative for appetite change, chills, fatigue and fever.  HENT: Negative.   Eyes: Positive for visual disturbance.       Chronic.  Respiratory: Negative for chest tightness and shortness of breath.   Cardiovascular: Negative for chest pain and palpitations.  Gastrointestinal: Negative for abdominal pain, nausea and vomiting.  Endocrine: Negative.   Allergic/Immunologic: Negative.   Neurological: Negative for dizziness and weakness.  Psychiatric/Behavioral: Positive for dysphoric mood.    Social History   Tobacco Use  . Smoking status: Never Smoker  . Smokeless tobacco: Never Used  Substance Use Topics  . Alcohol use: No      Objective:   BP 128/68 (BP Location: Left Arm, Patient Position: Sitting, Cuff Size: Normal)   Pulse 60   Temp (!) 96.8 F (36 C) (Other (Comment))   Resp 18   Ht 5\' 3"  (1.6 m)   Wt 129 lb (58.5 kg)   SpO2 97%   BMI 22.85 kg/m  Vitals:   08/13/19 1538  BP: 128/68  Pulse: 60  Resp: 18  Temp: (!) 96.8 F (36 C)  TempSrc: Other (Comment)  SpO2: 97%  Weight: 129 lb (58.5 kg)  Height: 5\' 3"  (1.6 m)  Body mass index is 22.85 kg/m.  Physical Exam   No results found for any visits on 08/13/19.     Assessment & Plan    1. Adult hypothyroidism TSH normal  2. Hyperglycemia Last fasting blood sugar was 92.  3. Essential hypertension Good control on HCTZ More than 50% 25 minute visit spent in counseling or coordination of care  4. Hypercalcemia Stable at 10.7 the last 2 visits.  Repeat on next visit in the spring.  We will also obtain PTH and ionized calcium  5. Adjustment disorder with depressed mood I think this is totally due to isolation due to the Covid  pandemic.  Patient agrees along with daughter.  Follow-up in 2 months with a phone call.   Follow up 09/30/2018 with Telephone visit, concerning depression screening.      Richard Cranford Mon, MD  [facility]

## 2019-08-13 ENCOUNTER — Encounter: Payer: Self-pay | Admitting: Family Medicine

## 2019-08-13 ENCOUNTER — Other Ambulatory Visit: Payer: Self-pay

## 2019-08-13 ENCOUNTER — Ambulatory Visit (INDEPENDENT_AMBULATORY_CARE_PROVIDER_SITE_OTHER): Payer: PPO | Admitting: Family Medicine

## 2019-08-13 VITALS — BP 128/68 | HR 60 | Temp 96.8°F | Resp 18 | Ht 63.0 in | Wt 129.0 lb

## 2019-08-13 DIAGNOSIS — R739 Hyperglycemia, unspecified: Secondary | ICD-10-CM | POA: Diagnosis not present

## 2019-08-13 DIAGNOSIS — F4321 Adjustment disorder with depressed mood: Secondary | ICD-10-CM | POA: Diagnosis not present

## 2019-08-13 DIAGNOSIS — E039 Hypothyroidism, unspecified: Secondary | ICD-10-CM

## 2019-08-13 DIAGNOSIS — I10 Essential (primary) hypertension: Secondary | ICD-10-CM

## 2019-08-23 ENCOUNTER — Encounter: Payer: Self-pay | Admitting: Family Medicine

## 2019-10-02 ENCOUNTER — Ambulatory Visit: Payer: Self-pay | Admitting: Family Medicine

## 2019-10-03 ENCOUNTER — Telehealth: Payer: Self-pay

## 2019-10-03 NOTE — Telephone Encounter (Signed)
Copied from [place] [id number]. Topic: General - Other >> Oct 03, 2019  3:13 PM Keene Breath wrote: Reason for CRM: Patient's daughter called to ask a question regarding a COVID vaccine.  [phone]

## 2019-10-04 ENCOUNTER — Ambulatory Visit: Payer: PPO

## 2019-10-04 NOTE — Telephone Encounter (Signed)
Returned call to patient's daughter [name].

## 2019-10-23 ENCOUNTER — Encounter: Payer: Self-pay | Admitting: Family Medicine

## 2019-10-23 ENCOUNTER — Ambulatory Visit (INDEPENDENT_AMBULATORY_CARE_PROVIDER_SITE_OTHER): Payer: PPO | Admitting: Family Medicine

## 2019-10-23 DIAGNOSIS — M7540 Impingement syndrome of unspecified shoulder: Secondary | ICD-10-CM | POA: Diagnosis not present

## 2019-10-23 DIAGNOSIS — I10 Essential (primary) hypertension: Secondary | ICD-10-CM

## 2019-10-23 DIAGNOSIS — R739 Hyperglycemia, unspecified: Secondary | ICD-10-CM

## 2019-10-23 DIAGNOSIS — M8580 Other specified disorders of bone density and structure, unspecified site: Secondary | ICD-10-CM | POA: Diagnosis not present

## 2019-10-23 DIAGNOSIS — E039 Hypothyroidism, unspecified: Secondary | ICD-10-CM | POA: Diagnosis not present

## 2019-10-23 NOTE — Progress Notes (Signed)
Patient: Felicia Weaver Female    DOB: 22-Sep-1924   86 y.o.   MRN: [id number] Visit Date: 10/23/2019  Today's Provider: [name], MD   No chief complaint on file.  Subjective:    Virtual Visit via Telephone Note  I connected with Felicia Weaver on 10/23/19 at  1:40 PM EST by telephone and verified that I am speaking with the correct person using two identifiers.  Location: Patient: home Provider: office   I discussed the limitations, risks, security and privacy concerns of performing an evaluation and management service by telephone and the availability of in person appointments. I also discussed with the patient that there may be a patient responsible charge related to this service. The patient expressed understanding and agreed to proceed.   HPI Patient doing well.  She lives in an assisted living.  She has had her first Covid vaccine.  She has no complaints.  She is getting a little frustrated with Covid restrictions. Allergies  Allergen Reactions  . Betagan  [Levobunolol Hcl] Other (See Comments)    Not Assessed  . Brimonidine Tartrate Other (See Comments)    Not Assessed Itching eyes  . Other Other (See Comments)    PROPINE. PROPINE -itching eyes  . Sulfa Antibiotics Nausea Only     Current Outpatient Medications:  .  aspirin 81 MG tablet, Take 81 mg by mouth daily. , Disp: , Rfl:  .  hydrochlorothiazide (HYDRODIURIL) 25 MG tablet, TAKE ONE TABLET EVERY DAY, Disp: 90 tablet, Rfl: 2 .  MAGNESIUM PO, Take by mouth., Disp: , Rfl:  .  Melatonin-Pyridoxine (MELATIN PO), Take by mouth., Disp: , Rfl:  .  moxifloxacin (VIGAMOX) 0.5 % ophthalmic solution, Place 1 drop into the left eye daily. , Disp: , Rfl:  .  Multiple Vitamins tablet, Take 1 tablet by mouth daily. , Disp: , Rfl:  .  triamcinolone cream (KENALOG) 0.1 %, , Disp: , Rfl:  .  vitamin B-12 (CYANOCOBALAMIN) 1000 MCG tablet, Take 1,000 mcg by mouth daily., Disp: , Rfl:   Review of Systems  Social  History   Tobacco Use  . Smoking status: Never Smoker  . Smokeless tobacco: Never Used  Substance Use Topics  . Alcohol use: No      Objective:   There were no vitals taken for this visit. There were no vitals filed for this visit.There is no height or weight on file to calculate BMI.   Physical Exam   No results found for any visits on 10/23/19.     Assessment & Plan     1. Essential hypertension Controlled Patient doing well emotionally.  She is looking forward to getting out and seeing family and friends when Covid restrictions slowly lifted.  We discussed this at some length along with her daughter. 2. Adult hypothyroidism Follow-up TSH later this year  3. Hyperglycemia A1c later in the year  4. Impingement syndrome of shoulder region, unspecified laterality   5. Osteopenia, unspecified location BMD when appropriate.  I discussed the assessment and treatment plan with the patient. The patient was provided an opportunity to ask questions and all were answered. The patient agreed with the plan and demonstrated an understanding of the instructions.   The patient was advised to call back or seek an in-person evaluation if the symptoms worsen or if the condition fails to improve as anticipated.  I provided 10 minutes of non-face-to-face time during this encounter.    Richard Rosanna Randy  Brooke Bonito, [facility]

## 2019-11-07 DIAGNOSIS — H903 Sensorineural hearing loss, bilateral: Secondary | ICD-10-CM | POA: Diagnosis not present

## 2019-11-07 DIAGNOSIS — H6123 Impacted cerumen, bilateral: Secondary | ICD-10-CM | POA: Diagnosis not present

## 2019-11-07 DIAGNOSIS — R42 Dizziness and giddiness: Secondary | ICD-10-CM | POA: Diagnosis not present

## 2019-11-27 DIAGNOSIS — H179 Unspecified corneal scar and opacity: Secondary | ICD-10-CM | POA: Diagnosis not present

## 2019-12-04 DIAGNOSIS — M7541 Impingement syndrome of right shoulder: Secondary | ICD-10-CM | POA: Diagnosis not present

## 2020-02-25 ENCOUNTER — Other Ambulatory Visit: Payer: Self-pay | Admitting: Family Medicine

## 2020-02-25 DIAGNOSIS — H179 Unspecified corneal scar and opacity: Secondary | ICD-10-CM | POA: Diagnosis not present

## 2020-02-28 NOTE — Progress Notes (Signed)
Subjective:   Felicia Weaver is a 86 y.o. female who presents for Medicare Annual (Subsequent) preventive examination.  I connected with Brigitte Pulse today by telephone and verified that I am speaking with the correct person using two identifiers. Location patient: home Location provider: work Persons participating in the virtual visit: patient, provider.   I discussed the limitations, risks, security and privacy concerns of performing an evaluation and management service by telephone and the availability of in person appointments. I also discussed with the patient that there may be a patient responsible charge related to this service. The patient expressed understanding and verbally consented to this telephonic visit.    Interactive audio and video telecommunications were attempted between this provider and patient, however failed, due to patient having technical difficulties OR patient did not have access to video capability.  We continued and completed visit with audio only.   Review of Systems:  N/A  Cardiac Risk Factors include: advanced age (>69men, >78 women);hypertension     Objective:     Vitals: There were no vitals taken for this visit.  There is no height or weight on file to calculate BMI.  Advanced Directives 03/03/2020 05/02/2018 04/26/2017  Does Patient Have a Medical Advance Directive? Yes Yes Yes  Type of Paramedic of [place];Living will Living will [place];Living will  Does patient want to make changes to medical advance directive? - Yes (MAU/Ambulatory/Procedural Areas - Information given) -  Copy of [place] in Chart? Yes - validated most recent copy scanned in chart (See row information) - No - copy requested    Tobacco Social History   Tobacco Use  Smoking Status Never Smoker  Smokeless Tobacco Never Used     Counseling given: Not Answered   Clinical Intake:  Pre-visit preparation  completed: Yes  Pain : No/denies pain     Nutritional Risks: None Diabetes: No  How often do you need to have someone help you when you read instructions, pamphlets, or other written materials from your doctor or pharmacy?: 1 - Never  Interpreter Needed?: No  Information entered by :: [facility], LPN  Past Medical History:  Diagnosis Date  . Fracture    right arm  . Glaucoma   . Hyperlipidemia   . Hypertension    Past Surgical History:  Procedure Laterality Date  . BREAST BIOPSY Left 07-30-13   stereotatic  . BREAST BIOPSY Left 08/28/13   benign/Flat epithelial atypia without severe dysplasia or malignancy. Surgical margins free of atypia  . CHOLECYSTECTOMY  Nov 2012  . HAND SURGERY    . MOHS SURGERY     Family History  Problem Relation Age of Onset  . Hypertension Sister   . Dementia Sister   . Dementia Mother   . Stroke Father   . Heart attack Brother   . Cancer Brother   . Stroke Brother   . Heart disease Sister    Social History   Socioeconomic History  . Marital status: Widowed    Spouse name: Not on file  . Number of children: 2  . Years of education: Not on file  . Highest education level: High school graduate  Occupational History  . Occupation: retired  Tobacco Use  . Smoking status: Never Smoker  . Smokeless tobacco: Never Used  Vaping Use  . Vaping Use: Never used  Substance and Sexual Activity  . Alcohol use: No  . Drug use: No  . Sexual activity: Not  on file  Other Topics Concern  . Not on file  Social History Narrative  . Not on file   Social Determinants of Health   Financial Resource Strain: Low Risk   . Difficulty of Paying Living Expenses: Not hard at all  Food Insecurity: No Food Insecurity  . Worried About Charity fundraiser in the Last Year: Never true  . Ran Out of Food in the Last Year: Never true  Transportation Needs: No Transportation Needs  . Lack of Transportation (Medical): No  . Lack of Transportation  (Non-Medical): No  Physical Activity: Inactive  . Days of Exercise per Week: 0 days  . Minutes of Exercise per Session: 0 min  Stress: No Stress Concern Present  . Feeling of Stress : Not at all  Social Connections: Moderately Isolated  . Frequency of Communication with Friends and Family: More than three times a week  . Frequency of Social Gatherings with Friends and Family: More than three times a week  . Attends Religious Services: More than 4 times per year  . Active Member of Clubs or Organizations: No  . Attends Archivist Meetings: Never  . Marital Status: Widowed    Outpatient Encounter Medications as of 03/03/2020  Medication Sig  . aspirin 81 MG tablet Take 81 mg by mouth daily.   . Cholecalciferol (VITAMIN D3) 10 MCG (400 UNIT) tablet Take by mouth daily. Dose unknown  . hydrochlorothiazide (HYDRODIURIL) 25 MG tablet TAKE 1 TABLET BY MOUTH DAILY  . MAGNESIUM PO Take by mouth daily. Unsure dose  . Melatonin-Pyridoxine (MELATIN PO) Take by mouth at bedtime as needed. Unsure dose  . Multiple Vitamins tablet Take 1 tablet by mouth daily.   . vitamin B-12 (CYANOCOBALAMIN) 1000 MCG tablet Take 1,000 mcg by mouth daily.  Marland Kitchen moxifloxacin (VIGAMOX) 0.5 % ophthalmic solution Place 1 drop into the left eye daily.  (Patient not taking: Reported on 03/03/2020)  . triamcinolone cream (KENALOG) 0.1 %  (Patient not taking: Reported on 03/03/2020)   No facility-administered encounter medications on file as of 03/03/2020.    Activities of Daily Living In your present state of health, do you have any difficulty performing the following activities: 03/03/2020  Hearing? Y  Comment Has hearing loss in both ears. Does not wear hearing aids.  Vision? Y  Comment Blind in the left eye. Wears eye glasses.  Difficulty concentrating or making decisions? N  Walking or climbing stairs? N  Dressing or bathing? N  Doing errands, shopping? N  Preparing Food and eating ? N  Using the Toilet? N    In the past six months, have you accidently leaked urine? Y  Comment Occasionally with pressure. Wears protection daily.  Do you have problems with loss of bowel control? N  Managing your Medications? N  Managing your Finances? N  Housekeeping or managing your Housekeeping? N  Some recent data might be hidden    Patient Care Team: Jerrol Banana., MD as PCP - General (Family Medicine) Dingeldein, Remo Lipps, MD as Consulting Physician (Ophthalmology) Margaretha Sheffield, MD (Otolaryngology) Ralene Bathe, MD (Dermatology) Zara Council as Physician Assistant (Orthopedic Surgery) Earnestine Leys, MD (Orthopedic Surgery)    Assessment:   This is a routine wellness examination for [facility].  Exercise Activities and Dietary recommendations Current Exercise Habits: Home exercise routine, Type of exercise: walking, Time (Minutes): 30 (leisurely walk), Frequency (Times/Week): 7, Weekly Exercise (Minutes/Week): 210, Intensity: Mild, Exercise limited by: None identified  Goals    .  Increase water intake     Recommend increasing water intake to 6 glasses a day.     Marland Kitchen LIFESTYLE - DECREASE FALLS RISK     Recommend to remove any items from the home that may cause slips or trips.       Fall Risk: Fall Risk  03/03/2020 08/13/2019 08/03/2019 05/02/2018 04/26/2017  Falls in the past year? 1 0 0 No Yes  Comment - - Emmi Telephone Survey: data to providers prior to load - -  Number falls in past yr: 1 0 - - 1  Injury with Fall? 0 0 - - No  Risk for fall due to : Other (Comment) - - - -  Risk for fall due to: Comment Due to orthostatic hypotension. - - - -  Follow up Falls prevention discussed Falls evaluation completed - - Falls prevention discussed    FALL RISK PREVENTION PERTAINING TO THE HOME:  Any stairs in or around the home? Yes  If so, are there any without handrails? No   Home free of loose throw rugs in walkways, pet beds, electrical cords, etc? Yes  Adequate lighting in  your home to reduce risk of falls? Yes   ASSISTIVE DEVICES UTILIZED TO PREVENT FALLS:  Life alert? No  Use of a cane, walker or w/c? Yes  Grab bars in the bathroom? Yes  Shower chair or bench in shower? Yes  Elevated toilet seat or a handicapped toilet? Yes    TIMED UP AND GO:  Was the test performed? No .    Depression Screen PHQ 2/9 Scores 03/03/2020 08/13/2019 05/02/2018 04/26/2017  PHQ - 2 Score 1 2 0 0  PHQ- 9 Score - 13 5 2      Cognitive Function     6CIT Screen 03/03/2020 05/02/2018 04/26/2017  What Year? 4 points 0 points 0 points  What month? 0 points 0 points 0 points  What time? 3 points 0 points 0 points  Count back from 20 0 points 0 points 0 points  Months in reverse 4 points 4 points 4 points  Repeat phrase 6 points 10 points 8 points  Total Score 17 14 12     Immunization History  Administered Date(s) Administered  . Influenza, High Dose Seasonal PF 07/22/2015, 06/23/2016, 08/24/2017, 06/21/2018, 06/30/2019  . Pneumococcal Conjugate-13 06/04/2014  . Pneumococcal Polysaccharide-23 07/23/2006  . Td 12/27/2008  . Zoster 04/26/2012    Qualifies for Shingles Vaccine? Yes  Zostavax completed 04/26/12. Due for Shingrix. Pt has been advised to call insurance company to determine out of pocket expense. Advised may also receive vaccine at local pharmacy or Health Dept. Verbalized acceptance and understanding.  Tdap: Although this vaccine is not a covered service during a Wellness Exam, does the patient still wish to receive this vaccine today?  No . Advised may receive this vaccine at local pharmacy or Health Dept. Aware to provide a copy of the vaccination record if obtained from local pharmacy or Health Dept. Verbalized acceptance and understanding.  Flu Vaccine: Up to date  Pneumococcal Vaccine: Completed series  Screening Tests Health Maintenance  Topic Date Due  . DEXA SCAN  Never done  . COVID-19 Vaccine (1) Never done  . TETANUS/TDAP  03/03/2021  (Originally 12/28/2018)  . INFLUENZA VACCINE  04/13/2020  . PNA vac Low Risk Adult  Completed    Cancer Screenings:  Colorectal Screening: No longer required.   Mammogram: No longer required.   Bone Density: Completed 01/26/06. Results reflect OSTEOPOROSIS. Repeat every 2 years.  Declined order at this time. Pt to f/u with PCP about having this completed again.  Lung Cancer Screening: (Low Dose CT Chest recommended if Age 33-80 years, 30 pack-year currently smoking OR have quit w/in 15years.) does not qualify.   Additional Screening:  Vision Screening: Recommended annual ophthalmology exams for early detection of glaucoma and other disorders of the eye.  Dental Screening: Recommended annual dental exams for proper oral hygiene  Community Resource Referral:  CRR required this visit? No     Plan:  I have personally reviewed and addressed the Medicare Annual Wellness questionnaire and have noted the following in the patient's chart:  A. Medical and social history B. Use of alcohol, tobacco or illicit drugs  C. Current medications and supplements D. Functional ability and status E.  Nutritional status F.  Physical activity G. Advance directives H. List of other physicians I.  Hospitalizations, surgeries, and ER visits in previous 12 months J.  [place] such as hearing and vision if needed, cognitive and depression L. Referrals and appointments   In addition, I have reviewed and discussed with patient certain preventive protocols, quality metrics, and best practice recommendations. A written personalized care plan for preventive services as well as general preventive health recommendations were provided to patient. Nurse Health Advisor  Signed,    Feliciana Narayan [place], [place]  [birth year] Nurse Health Advisor   Nurse Notes: Pt declined a DEXA scan order at this time. Pt would like to speak with PCP about this further.

## 2020-03-03 ENCOUNTER — Ambulatory Visit (INDEPENDENT_AMBULATORY_CARE_PROVIDER_SITE_OTHER): Payer: PPO

## 2020-03-03 ENCOUNTER — Other Ambulatory Visit: Payer: Self-pay

## 2020-03-03 DIAGNOSIS — Z0000 Encounter for general adult medical examination without abnormal findings: Secondary | ICD-10-CM | POA: Diagnosis not present

## 2020-03-03 NOTE — Patient Instructions (Signed)
[name] , Thank you for taking time to come for your Medicare Wellness Visit. I appreciate your ongoing commitment to your health goals. Please review the following plan we discussed and let me know if I can assist you in the future.   Screening recommendations/referrals: Colonoscopy: No longer required.  Mammogram: No longer required.  Bone Density: Currently due. Declined order at this time- pt to follow up with PCP at next apt.  Recommended yearly ophthalmology/optometry visit for glaucoma screening and checkup Recommended yearly dental visit for hygiene and checkup  Vaccinations: Influenza vaccine: Done 06/30/19 Pneumococcal vaccine: Completed series Tdap vaccine: Currently due. Declined today. Shingles vaccine: Currently due. Declined today.     Advanced directives: Currently on file.  Conditions/risks identified: Fall risk preventatives discussed today. Recommend increasing water intake to 6-8 8 oz glasses a day.  Next appointment: 03/27/20 @ 1:20 PM with Dr Felicia Weaver. Declined scheduling an AWV for 2022 at this time.   Preventive Care 65 Years and Older, Female Preventive care refers to lifestyle choices and visits with your health care provider that can promote health and wellness. What does preventive care include?  A yearly physical exam. This is also called an annual well check.  Dental exams once or twice a year.  Routine eye exams. Ask your health care provider how often you should have your eyes checked.  Personal lifestyle choices, including:  Daily care of your teeth and gums.  Regular physical activity.  Eating a healthy diet.  Avoiding tobacco and drug use.  Limiting alcohol use.  Practicing safe sex.  Taking low-dose aspirin every day.  Taking vitamin and mineral supplements as recommended by your health care provider. What happens during an annual well check? The services and screenings done by your health care provider during your annual well  check will depend on your age, overall health, lifestyle risk factors, and family history of disease. Counseling  Your health care provider may ask you questions about your:  Alcohol use.  Tobacco use.  Drug use.  Emotional well-being.  Home and relationship well-being.  Sexual activity.  Eating habits.  History of falls.  Memory and ability to understand (cognition).  Work and work Statistician.  Reproductive health. Screening  You may have the following tests or measurements:  Height, weight, and BMI.  Blood pressure.  Lipid and cholesterol levels. These may be checked every 5 years, or more frequently if you are over 30 years old.  Skin check.  Lung cancer screening. You may have this screening every year starting at age 7 if you have a 30-pack-year history of smoking and currently smoke or have quit within the past 15 years.  Fecal occult blood test (FOBT) of the stool. You may have this test every year starting at age 86.  Flexible sigmoidoscopy or colonoscopy. You may have a sigmoidoscopy every 5 years or a colonoscopy every 10 years starting at age 39.  Hepatitis C blood test.  Hepatitis B blood test.  Sexually transmitted disease (STD) testing.  Diabetes screening. This is done by checking your blood sugar (glucose) after you have not eaten for a while (fasting). You may have this done every 1-3 years.  Bone density scan. This is done to screen for osteoporosis. You may have this done starting at age 9.  Mammogram. This may be done every 1-2 years. Talk to your health care provider about how often you should have regular mammograms. Talk with your health care provider about your test results, treatment options,  and if necessary, the need for more tests. Vaccines  Your health care provider may recommend certain vaccines, such as:  Influenza vaccine. This is recommended every year.  Tetanus, diphtheria, and acellular pertussis (Tdap, Td) vaccine. You  may need a Td booster every 10 years.  Zoster vaccine. You may need this after age 63.  Pneumococcal 13-valent conjugate (PCV13) vaccine. One dose is recommended after age 44.  Pneumococcal polysaccharide (PPSV23) vaccine. One dose is recommended after age 66. Talk to your health care provider about which screenings and vaccines you need and how often you need them. This information is not intended to replace advice given to you by your health care provider. Make sure you discuss any questions you have with your health care provider. Document Released: 09/26/2015 Document Revised: 05/19/2016 Document Reviewed: 07/01/2015 Elsevier Interactive Patient Education  2017 [place] Prevention in the Home Falls can cause injuries. They can happen to people of all ages. There are many things you can do to make your home safe and to help prevent falls. What can I do on the outside of my home?  Regularly fix the edges of walkways and driveways and fix any cracks.  Remove anything that might make you trip as you walk through a door, such as a raised step or threshold.  Trim any bushes or trees on the path to your home.  Use bright outdoor lighting.  Clear any walking paths of anything that might make someone trip, such as rocks or tools.  Regularly check to see if handrails are loose or broken. Make sure that both sides of any steps have handrails.  Any raised decks and porches should have guardrails on the edges.  Have any leaves, snow, or ice cleared regularly.  Use sand or salt on walking paths during winter.  Clean up any spills in your garage right away. This includes oil or grease spills. What can I do in the bathroom?  Use night lights.  Install grab bars by the toilet and in the tub and shower. Do not use towel bars as grab bars.  Use non-skid mats or decals in the tub or shower.  If you need to sit down in the shower, use a plastic, non-slip stool.  Keep the floor  dry. Clean up any water that spills on the floor as soon as it happens.  Remove soap buildup in the tub or shower regularly.  Attach bath mats securely with double-sided non-slip rug tape.  Do not have throw rugs and other things on the floor that can make you trip. What can I do in the bedroom?  Use night lights.  Make sure that you have a light by your bed that is easy to reach.  Do not use any sheets or blankets that are too big for your bed. They should not hang down onto the floor.  Have a firm chair that has side arms. You can use this for support while you get dressed.  Do not have throw rugs and other things on the floor that can make you trip. What can I do in the kitchen?  Clean up any spills right away.  Avoid walking on wet floors.  Keep items that you use a lot in easy-to-reach places.  If you need to reach something above you, use a strong step stool that has a grab bar.  Keep electrical cords out of the way.  Do not use floor polish or wax that makes floors slippery. If  you must use wax, use non-skid floor wax.  Do not have throw rugs and other things on the floor that can make you trip. What can I do with my stairs?  Do not leave any items on the stairs.  Make sure that there are handrails on both sides of the stairs and use them. Fix handrails that are broken or loose. Make sure that handrails are as long as the stairways.  Check any carpeting to make sure that it is firmly attached to the stairs. Fix any carpet that is loose or worn.  Avoid having throw rugs at the top or bottom of the stairs. If you do have throw rugs, attach them to the floor with carpet tape.  Make sure that you have a light switch at the top of the stairs and the bottom of the stairs. If you do not have them, ask someone to add them for you. What else can I do to help prevent falls?  Wear shoes that:  Do not have high heels.  Have rubber bottoms.  Are comfortable and fit you  well.  Are closed at the toe. Do not wear sandals.  If you use a stepladder:  Make sure that it is fully opened. Do not climb a closed stepladder.  Make sure that both sides of the stepladder are locked into place.  Ask someone to hold it for you, if possible.  Clearly mark and make sure that you can see:  Any grab bars or handrails.  First and last steps.  Where the edge of each step is.  Use tools that help you move around (mobility aids) if they are needed. These include:  Canes.  Walkers.  Scooters.  Crutches.  Turn on the lights when you go into a dark area. Replace any light bulbs as soon as they burn out.  Set up your furniture so you have a clear path. Avoid moving your furniture around.  If any of your floors are uneven, fix them.  If there are any pets around you, be aware of where they are.  Review your medicines with your doctor. Some medicines can make you feel dizzy. This can increase your chance of falling. Ask your doctor what other things that you can do to help prevent falls. This information is not intended to replace advice given to you by your health care provider. Make sure you discuss any questions you have with your health care provider. Document Released: 06/26/2009 Document Revised: 02/05/2016 Document Reviewed: 10/04/2014 Elsevier Interactive Patient Education  2017 [employer].

## 2020-03-26 NOTE — Progress Notes (Signed)
Established patient visit  I,[name],acting as a scribe for [name], MD.,have documented all relevant documentation on the behalf of [name], MD,as directed by  [name], MD while in the presence of [name], MD.   Patient: Felicia Weaver   DOB: 1925-08-06   86 y.o. Female  MRN: [id number] Visit Date: 03/27/2020  Today's healthcare provider: [name], MD   Chief Complaint  Patient presents with  . Follow-up  . Hypertension  . Hyperglycemia   Subjective    HPI   Hypertension, follow-up  BP Readings from Last 3 Encounters:  03/27/20 132/60  08/13/19 128/68  03/13/19 (!) 136/58   Wt Readings from Last 3 Encounters:  03/27/20 127 lb (57.6 kg)  08/13/19 129 lb (58.5 kg)  03/13/19 134 lb 6.4 oz (61 kg)     She was last seen for hypertension 5 months ago.  BP at that visit was . Management since that visit includes no changes. She reports good compliance with treatment. She is not having side effects.  She is not exercising. She is not adherent to low salt diet.   Outside blood pressures are checked occasionally.  She does not smoke.  Use of agents associated with hypertension: none.    Hyperglycemia From 10/23/2019-A1c later in the year.       Medications: Outpatient Medications Prior to Visit  Medication Sig  . aspirin 81 MG tablet Take 81 mg by mouth daily.   . Cholecalciferol (VITAMIN D3) 10 MCG (400 UNIT) tablet Take by mouth daily. Dose unknown  . hydrochlorothiazide (HYDRODIURIL) 25 MG tablet TAKE 1 TABLET BY MOUTH DAILY  . MAGNESIUM PO Take by mouth daily. Unsure dose  . Melatonin-Pyridoxine (MELATIN PO) Take by mouth at bedtime as needed. Unsure dose  . Multiple Vitamins tablet Take 1 tablet by mouth daily.   . vitamin B-12 (CYANOCOBALAMIN) 1000 MCG tablet Take 1,000 mcg by mouth daily.  Marland Kitchen moxifloxacin (VIGAMOX) 0.5 % ophthalmic solution Place 1 drop into the left eye daily.  (Patient not  taking: Reported on 03/03/2020)  . triamcinolone cream (KENALOG) 0.1 %  (Patient not taking: Reported on 03/03/2020)   No facility-administered medications prior to visit.    Review of Systems  Constitutional: Negative.   HENT: Positive for hearing loss.   Eyes: Positive for visual disturbance.       Mostly blind left eye.  Respiratory: Negative.   Cardiovascular: Negative.   Gastrointestinal: Negative.   Endocrine: Negative.   Skin:       Skin lesion where her glasses hit her left side of her face just lateral to her nose and below her left eye.  Allergic/Immunologic: Negative.   Neurological: Negative.   Hematological: Bruises/bleeds easily.  Psychiatric/Behavioral: Negative.        Objective    BP 132/60   Pulse 60   Temp (!) 97.1 F (36.2 C)   Resp 16   Wt 127 lb (57.6 kg)   BMI 22.50 kg/m     Physical Exam Vitals reviewed.  Constitutional:      Appearance: She is well-developed.  HENT:     Head: Normocephalic and atraumatic.  Eyes:     Conjunctiva/sclera: Conjunctivae normal.     Pupils: Pupils are equal, round, and reactive to light.  Cardiovascular:     Rate and Rhythm: Normal rate and regular rhythm.     Heart sounds: Normal heart sounds.  Pulmonary:     Effort: Pulmonary  effort is normal.     Breath sounds: Normal breath sounds.  Abdominal:     Palpations: Abdomen is soft.  Musculoskeletal:        General: Normal range of motion.     Cervical back: Normal range of motion and neck supple.  Skin:    General: Skin is warm and dry.     Comments: Small skin lesion under left eye that could be a superficial tear versus squamous cell versus basal cell carcinoma  Neurological:     General: No focal deficit present.     Mental Status: She is alert and oriented to person, place, and time.     Deep Tendon Reflexes: Reflexes are normal and symmetric.  Psychiatric:        Mood and Affect: Mood normal.        Behavior: Behavior normal.        Thought  Content: Thought content normal.        Judgment: Judgment normal.       No results found for any visits on 03/27/20.  Assessment & Plan    1. Essential hypertension   2. Adult hypothyroidism   3. Hyperglycemia A1c is 6.0.  No further A1c is other than with routine labs. - POCT glycosylated hemoglobin (Hb A1C)  4. Osteoporosis, unspecified osteoporosis type, unspecified pathological fracture presence Follows is the biggest risk. - DG Bone Density  5. Hypercalcemia Stop HCTZ 6.  Skin lesion of left side of face Refer to dermatology if this does not heal.   No follow-ups on file.      I, [name], MD, have reviewed all documentation for this visit. The documentation on 03/29/20 for the exam, diagnosis, procedures, and orders are all accurate and complete.    Hayden Kihara Cranford Mon, MD  [facility] [phone] (phone) [phone] (fax)  [place]

## 2020-03-27 ENCOUNTER — Ambulatory Visit (INDEPENDENT_AMBULATORY_CARE_PROVIDER_SITE_OTHER): Payer: PPO | Admitting: Family Medicine

## 2020-03-27 ENCOUNTER — Other Ambulatory Visit: Payer: Self-pay

## 2020-03-27 ENCOUNTER — Encounter: Payer: Self-pay | Admitting: Family Medicine

## 2020-03-27 VITALS — BP 132/60 | HR 60 | Temp 97.1°F | Resp 16 | Wt 127.0 lb

## 2020-03-27 DIAGNOSIS — M810 Age-related osteoporosis without current pathological fracture: Secondary | ICD-10-CM

## 2020-03-27 DIAGNOSIS — E039 Hypothyroidism, unspecified: Secondary | ICD-10-CM | POA: Diagnosis not present

## 2020-03-27 DIAGNOSIS — I10 Essential (primary) hypertension: Secondary | ICD-10-CM

## 2020-03-27 DIAGNOSIS — R739 Hyperglycemia, unspecified: Secondary | ICD-10-CM

## 2020-03-27 DIAGNOSIS — L989 Disorder of the skin and subcutaneous tissue, unspecified: Secondary | ICD-10-CM | POA: Diagnosis not present

## 2020-03-27 LAB — POCT GLYCOSYLATED HEMOGLOBIN (HGB A1C): Hemoglobin A1C: 6 % — AB (ref 4.0–5.6)

## 2020-03-27 NOTE — Patient Instructions (Addendum)
Stop taking HCTZ (hydrochlorthiazide).

## 2020-05-21 ENCOUNTER — Ambulatory Visit
Admission: RE | Admit: 2020-05-21 | Discharge: 2020-05-21 | Disposition: A | Discharge status: Home / Self Care | Payer: PPO | Source: Ambulatory Visit | Attending: Family Medicine | Admitting: Family Medicine

## 2020-05-21 DIAGNOSIS — M810 Age-related osteoporosis without current pathological fracture: Secondary | ICD-10-CM | POA: Insufficient documentation

## 2020-05-21 DIAGNOSIS — Z780 Asymptomatic menopausal state: Secondary | ICD-10-CM | POA: Diagnosis not present

## 2020-05-22 ENCOUNTER — Telehealth: Payer: Self-pay

## 2020-05-22 DIAGNOSIS — M810 Age-related osteoporosis without current pathological fracture: Secondary | ICD-10-CM

## 2020-05-22 MED ORDER — ALENDRONATE SODIUM 70 MG PO TABS: 70.0000 mg | ORAL_TABLET | ORAL | 11 refills | Status: DC

## 2020-05-22 NOTE — Telephone Encounter (Signed)
Advised patients daughter Gay Filler of results, she states that she will discuss more in detail with Dr. Rosanna Randy when patient comes into office on Wednesday. Gay Filler reports that Dr. Rosanna Randy took patient off of calcium a year ago. KW

## 2020-05-22 NOTE — Telephone Encounter (Signed)
-----   Message from Jerrol Banana., MD sent at 05/21/2020  5:33 PM EDT ----- Osteoporotic--start alendronate 70mg  weekly in adition to Calcium and Vit D daily

## 2020-05-26 NOTE — Progress Notes (Signed)
Established patient visit   Patient: Felicia Weaver   DOB: Apr 14, 1925   86 y.o. Female  MRN: [id number] Visit Date: 05/28/2020  Today's healthcare provider: [name], MD   Chief Complaint  Patient presents with   Hypertension   Osteoporosis   Subjective    HPI  Patient is just now starting Fosamax for osteoporosis.  Overall she feels well. Hypertension, follow-up  BP Readings from Last 3 Encounters:  05/28/20 (!) 142/62  03/27/20 132/60  08/13/19 128/68   Wt Readings from Last 3 Encounters:  05/28/20 129 lb 9.6 oz (58.8 kg)  03/27/20 127 lb (57.6 kg)  08/13/19 129 lb (58.5 kg)     She was last seen for hypertension 2 months ago.  BP at that visit was 132/60. Management since that visit includes stopping HCTZ.  She reports good compliance with treatment. She is not having side effects.  She is following a Regular diet. She is not exercising. She does not smoke.  Use of agents associated with hypertension: none.   Outside blood pressures are checked occasionally.  Follow up for osteoporosis   The patient was last seen for this 2 months ago. Changes made at last visit include starting Fosamax 70mg  once weekly.  She reports good compliance with treatment. She feels that condition is Unchanged. She is not having side effects. Patient reports that she has not started the [place] yet.        Medications: Outpatient Medications Prior to Visit  Medication Sig   alendronate (FOSAMAX) 70 MG tablet Take 1 tablet (70 mg total) by mouth every 7 (seven) days. Take with a full glass of water on an empty stomach.   aspirin 81 MG tablet Take 81 mg by mouth daily.    Cholecalciferol (VITAMIN D3) 10 MCG (400 UNIT) tablet Take by mouth daily. Dose unknown   hydrochlorothiazide (HYDRODIURIL) 25 MG tablet TAKE 1 TABLET BY MOUTH DAILY   MAGNESIUM PO Take by mouth daily. Unsure dose   Melatonin-Pyridoxine (MELATIN PO) Take by mouth at bedtime as needed.  Unsure dose   moxifloxacin (VIGAMOX) 0.5 % ophthalmic solution Place 1 drop into the left eye daily.  (Patient not taking: Reported on 03/03/2020)   Multiple Vitamins tablet Take 1 tablet by mouth daily.    triamcinolone cream (KENALOG) 0.1 %  (Patient not taking: Reported on 03/03/2020)   vitamin B-12 (CYANOCOBALAMIN) 1000 MCG tablet Take 1,000 mcg by mouth daily.   No facility-administered medications prior to visit.    Review of Systems  Constitutional: Negative.   Respiratory: Negative.   Cardiovascular: Negative.   Endocrine: Negative.   Musculoskeletal: Negative.   Neurological: Negative.     Last vitamin D No results found for: 25OHVITD2, 25OHVITD3, VD25OH    Objective    BP (!) 142/62    Pulse 60    Temp 98.4 F (36.9 C)    Resp 16    Wt 129 lb 9.6 oz (58.8 kg)    BMI 22.96 kg/m  BP Readings from Last 3 Encounters:  05/28/20 (!) 142/62  03/27/20 132/60  08/13/19 128/68   Wt Readings from Last 3 Encounters:  05/28/20 129 lb 9.6 oz (58.8 kg)  03/27/20 127 lb (57.6 kg)  08/13/19 129 lb (58.5 kg)      Physical Exam Vitals reviewed.  Constitutional:      Appearance: She is well-developed.  HENT:     Head: Normocephalic and atraumatic.  Eyes:     Conjunctiva/sclera: Conjunctivae normal.  Pupils: Pupils are equal, round, and reactive to light.  Cardiovascular:     Rate and Rhythm: Normal rate and regular rhythm.     Heart sounds: Normal heart sounds.  Pulmonary:     Effort: Pulmonary effort is normal.     Breath sounds: Normal breath sounds.  Abdominal:     Palpations: Abdomen is soft.  Musculoskeletal:        General: Normal range of motion.     Cervical back: Normal range of motion and neck supple.  Skin:    General: Skin is warm and dry.     Comments: Very fair skin.  Neurological:     General: No focal deficit present.     Mental Status: She is alert and oriented to person, place, and time.     Deep Tendon Reflexes: Reflexes are normal and  symmetric.  Psychiatric:        Mood and Affect: Mood normal.        Behavior: Behavior normal.        Thought Content: Thought content normal.        Judgment: Judgment normal.       No results found for any visits on 05/28/20.  Assessment & Plan      1. Essential hypertension  - Renal function panel  2. Hyperglycemia   3. Hypercalcemia Patient now off of HCTZ and calcium supplements. - Calcium  4. Renal insufficiency  - Renal function panel  5. Need for influenza vaccination  - Flu Vaccine QUAD High Dose(Fluad) 6.Osteoporosis Now on Fosamax.  No follow-ups on file.      I, [name], MD, have reviewed all documentation for this visit. The documentation on 06/01/20 for the exam, diagnosis, procedures, and orders are all accurate and complete.    Richard Cranford Mon, MD  [facility] [phone] (phone) [phone] (fax)  [place]

## 2020-05-28 ENCOUNTER — Other Ambulatory Visit: Payer: Self-pay

## 2020-05-28 ENCOUNTER — Ambulatory Visit (INDEPENDENT_AMBULATORY_CARE_PROVIDER_SITE_OTHER): Payer: PPO | Admitting: Family Medicine

## 2020-05-28 VITALS — BP 142/62 | HR 60 | Temp 98.4°F | Resp 16 | Wt 129.6 lb

## 2020-05-28 DIAGNOSIS — R739 Hyperglycemia, unspecified: Secondary | ICD-10-CM

## 2020-05-28 DIAGNOSIS — I10 Essential (primary) hypertension: Secondary | ICD-10-CM | POA: Diagnosis not present

## 2020-05-28 DIAGNOSIS — M810 Age-related osteoporosis without current pathological fracture: Secondary | ICD-10-CM

## 2020-05-28 DIAGNOSIS — Z23 Encounter for immunization: Secondary | ICD-10-CM | POA: Diagnosis not present

## 2020-05-28 DIAGNOSIS — N289 Disorder of kidney and ureter, unspecified: Secondary | ICD-10-CM | POA: Diagnosis not present

## 2020-05-29 LAB — RENAL FUNCTION PANEL
Albumin: 4.3 g/dL (ref 3.5–4.6)
BUN/Creatinine Ratio: 13 (ref 12–28)
BUN: 19 mg/dL (ref 10–36)
CO2: 29 mmol/L (ref 20–29)
Calcium: 10.2 mg/dL (ref 8.7–10.3)
Chloride: 103 mmol/L (ref 96–106)
Creatinine, Ser: 1.44 mg/dL — ABNORMAL HIGH (ref 0.57–1.00)
GFR calc Af Amer: 36 mL/min/{1.73_m2} — ABNORMAL LOW (ref >59)
GFR calc non Af Amer: 31 mL/min/{1.73_m2} — ABNORMAL LOW (ref >59)
Glucose: 99 mg/dL (ref 65–99)
Phosphorus: 3.5 mg/dL (ref 3.0–4.3)
Potassium: 4.8 mmol/L (ref 3.5–5.2)
Sodium: 142 mmol/L (ref 134–144)

## 2020-06-02 ENCOUNTER — Telehealth: Payer: Self-pay

## 2020-06-02 NOTE — Telephone Encounter (Signed)
-----   Message from Jerrol Banana., MD sent at 05/29/2020  4:37 PM EDT ----- Kidney function stable and calcium is better off of HCTZ

## 2020-06-02 NOTE — Telephone Encounter (Signed)
Patient's daughter advised of lab results.

## 2020-06-24 DIAGNOSIS — H18422 Band keratopathy, left eye: Secondary | ICD-10-CM | POA: Diagnosis not present

## 2020-11-02 ENCOUNTER — Encounter: Payer: Self-pay | Admitting: Family Medicine

## 2020-11-04 ENCOUNTER — Other Ambulatory Visit: Payer: Self-pay

## 2020-11-04 ENCOUNTER — Encounter: Payer: Self-pay | Admitting: Family Medicine

## 2020-11-04 ENCOUNTER — Telehealth: Payer: Self-pay

## 2020-11-04 ENCOUNTER — Ambulatory Visit (INDEPENDENT_AMBULATORY_CARE_PROVIDER_SITE_OTHER): Payer: PPO | Admitting: Family Medicine

## 2020-11-04 VITALS — BP 182/67 | HR 65 | Temp 98.5°F | Resp 18 | Wt 133.0 lb

## 2020-11-04 DIAGNOSIS — I10 Essential (primary) hypertension: Secondary | ICD-10-CM

## 2020-11-04 DIAGNOSIS — B029 Zoster without complications: Secondary | ICD-10-CM | POA: Diagnosis not present

## 2020-11-04 NOTE — Telephone Encounter (Signed)
I can see her at4 today

## 2020-11-04 NOTE — Telephone Encounter (Signed)
Copied from [place] [id number]. Topic: Appointment Scheduling - Scheduling Inquiry for Clinic >> Nov 04, 2020 10:11 AM Yvette Rack wrote: Reason for CRM: Pt daughter Pia Mau stated she submitted a photo of pt showing skin irritation with small blisters but she never heard back from anyone. [name] requested that pt be scheduled for an appt with Dr. Rosanna Randy today but there were no appts available. [name] asked that a message be sent to Dr. Rosanna Randy asking if pt could be worked in and requests call back. Cb# [phone]

## 2020-11-04 NOTE — Telephone Encounter (Signed)
Advised patient and appt scheduled.

## 2020-11-04 NOTE — Progress Notes (Signed)
I,[name],acting as a scribe for [name], MD.,have documented all relevant documentation on the behalf of [name], MD,as directed by  [name], MD while in the presence of [name], MD.   Established patient visit   Patient: Felicia Weaver   DOB: 1925-08-03   87 y.o. Female  MRN: [id number] Visit Date: 11/04/2020  Today's healthcare provider: [name], MD   Chief Complaint  Patient presents with  . Rash   Subjective    Rash This is a new problem. The current episode started in the past 7 days. The affected locations include the left axilla. The rash is characterized by pain, redness, blistering, itchiness and burning. She was exposed to nothing. Pertinent negatives include no anorexia, congestion, cough, diarrhea, eye pain, facial edema, fatigue, fever, joint pain, nail changes, rhinorrhea, shortness of breath, sore throat or vomiting. Treatments tried: hydrocortizone cream. The treatment provided mild relief.    Patient has had a rash on upper left breast and underarm. Rash is itchy, red, and burning. Patient's daughter states rash appeared to have blisters when it developed. Patient states she has not used any new soaps, detergents or deodorants. Patient treated rash with hydrocortizone cream with only mild relief.  The patient did have one shingles vaccine in the distant past. The rash is resolved over the past few days as has the discomfort.  He still has a very small area about the size of her pinky finger in the lower axilla on the left.      Medications: Outpatient Medications Prior to Visit  Medication Sig  . alendronate (FOSAMAX) 70 MG tablet Take 1 tablet (70 mg total) by mouth every 7 (seven) days. Take with a full glass of water on an empty stomach.  Marland Kitchen aspirin 81 MG tablet Take 81 mg by mouth daily.   . Cholecalciferol (VITAMIN D3) 10 MCG (400 UNIT) tablet Take by mouth daily. Dose unknown  . MAGNESIUM PO Take by  mouth daily. Unsure dose  . Melatonin-Pyridoxine (MELATIN PO) Take by mouth at bedtime as needed. Unsure dose  . Multiple Vitamins tablet Take 1 tablet by mouth daily.   . vitamin B-12 (CYANOCOBALAMIN) 1000 MCG tablet Take 1,000 mcg by mouth daily.  . hydrochlorothiazide (HYDRODIURIL) 25 MG tablet TAKE 1 TABLET BY MOUTH DAILY (Patient not taking: Reported on 11/04/2020)  . moxifloxacin (VIGAMOX) 0.5 % ophthalmic solution Place 1 drop into the left eye daily.  (Patient not taking: No sig reported)  . triamcinolone cream (KENALOG) 0.1 %  (Patient not taking: No sig reported)   No facility-administered medications prior to visit.    Review of Systems  Constitutional: Negative for fatigue and fever.  HENT: Negative for congestion, rhinorrhea and sore throat.   Eyes: Negative for pain.  Respiratory: Negative for cough and shortness of breath.   Gastrointestinal: Negative for anorexia, diarrhea and vomiting.  Musculoskeletal: Negative for joint pain.  Skin: Positive for rash. Negative for nail changes.        Objective    BP (!) 182/67 (BP Location: Right Arm, Patient Position: Sitting, Cuff Size: Normal)   Pulse 65   Temp 98.5 F (36.9 C) (Oral)   Resp 18   Wt 133 lb (60.3 kg)   SpO2 97%   BMI 23.56 kg/m  BP Readings from Last 3 Encounters:  11/04/20 (!) 182/67  05/28/20 (!) 142/62  03/27/20 132/60   Wt Readings from Last 3 Encounters:  11/04/20 133 lb (  60.3 kg)  05/28/20 129 lb 9.6 oz (58.8 kg)  03/27/20 127 lb (57.6 kg)       Physical Exam Vitals reviewed.  Constitutional:      Appearance: She is well-developed.  HENT:     Head: Normocephalic and atraumatic.  Eyes:     Conjunctiva/sclera: Conjunctivae normal.     Pupils: Pupils are equal, round, and reactive to light.  Cardiovascular:     Rate and Rhythm: Normal rate and regular rhythm.     Heart sounds: Normal heart sounds.  Pulmonary:     Effort: Pulmonary effort is normal.     Breath sounds: Normal breath  sounds.  Abdominal:     Palpations: Abdomen is soft.  Musculoskeletal:     Cervical back: Normal range of motion and neck supple.  Skin:    General: Skin is warm and dry.     Findings: Rash present.     Comments: Very fair skin. Patient has much improved rash in the left axilla with distal to her axilla just lateral to the breast showing a couple of erythematous areas.  No drainage.  Normally tender.  No vesicles left, there were vesicles earlier.  The daughter brings in a picture from a few days ago.  There is no lymphadenopathy  Neurological:     General: No focal deficit present.     Mental Status: She is alert and oriented to person, place, and time.     Deep Tendon Reflexes: Reflexes are normal and symmetric.  Psychiatric:        Mood and Affect: Mood normal.        Behavior: Behavior normal.        Thought Content: Thought content normal.        Judgment: Judgment normal.       No results found for any visits on 11/04/20.  Assessment & Plan     1. Herpes zoster without complication Mild cases patient has had vaccine in the Past.  No sign of secondary infection.  Treat with supportive care keep area clean.  No skin breakdown on area.  2. Essential hypertension Follow-up blood pressure at home.  Get home readings.    No follow-ups on file.      I, [name], MD, have reviewed all documentation for this visit. The documentation on 11/08/20 for the exam, diagnosis, procedures, and orders are all accurate and complete.    Felicia Cranford Mon, MD  [facility] [phone] (phone) [phone] (fax)  [place]

## 2020-11-25 ENCOUNTER — Telehealth: Payer: Self-pay | Admitting: Family Medicine

## 2020-11-25 ENCOUNTER — Other Ambulatory Visit: Payer: Self-pay

## 2020-11-25 ENCOUNTER — Encounter: Payer: Self-pay | Admitting: Family Medicine

## 2020-11-25 ENCOUNTER — Ambulatory Visit (INDEPENDENT_AMBULATORY_CARE_PROVIDER_SITE_OTHER): Payer: PPO | Admitting: Family Medicine

## 2020-11-25 VITALS — BP 178/70 | HR 60 | Temp 97.7°F | Resp 18 | Ht 63.0 in | Wt 131.0 lb

## 2020-11-25 DIAGNOSIS — L910 Hypertrophic scar: Secondary | ICD-10-CM | POA: Diagnosis not present

## 2020-11-25 DIAGNOSIS — N289 Disorder of kidney and ureter, unspecified: Secondary | ICD-10-CM | POA: Diagnosis not present

## 2020-11-25 DIAGNOSIS — M8580 Other specified disorders of bone density and structure, unspecified site: Secondary | ICD-10-CM | POA: Diagnosis not present

## 2020-11-25 DIAGNOSIS — I10 Essential (primary) hypertension: Secondary | ICD-10-CM | POA: Diagnosis not present

## 2020-11-25 MED ORDER — MOMETASONE FUROATE 0.1 % EX CREA: 1.0000 "application " | TOPICAL_CREAM | Freq: Every day | CUTANEOUS | 0 refills | Status: DC

## 2020-11-25 MED ORDER — AMLODIPINE BESYLATE 5 MG PO TABS: 5.0000 mg | ORAL_TABLET | Freq: Every day | ORAL | 3 refills | Status: DC

## 2020-11-25 NOTE — Patient Instructions (Signed)
Hot compresses to face twice daily.

## 2020-11-25 NOTE — Telephone Encounter (Signed)
Felicia Weaver,Joyce daughter states she is currently at the pharmacy and PCP was suppose to send the amlodipine (chart does not reflect). Caller would like a follow up call. Patient was seen today.   [place], [place] Phone:  [phone]  Fax:  [phone]

## 2020-11-25 NOTE — Progress Notes (Signed)
I,[name],acting as a scribe for [name], MD.,have documented all relevant documentation on the behalf of [name], MD,as directed by  [name], MD while in the presence of [name], MD.   Established patient visit   Patient: Felicia Weaver   DOB: 1924-10-30   87 y.o. Female  MRN: [id number] Visit Date: 11/25/2020  Today's healthcare provider: [name], MD   No chief complaint on file.  Subjective    HPI  Patient comes in today for follow-up.  Overall she feels well.  Her blood pressure seems to been running high recently but she has no chest pain or shortness of breath or neurologic symptoms.  She is no longer taking HCTZ. She has a couple of skin concerns today.  In general when she has had scar tissue is always become tender to the touch. She has some scar tissue from previous breast surgery in her left axilla/lateral breast that is hypertrophic and erythematous and tender to the touch. She has a spot that appears to be a vesicle in the left cheek that is tender to the touch.  No drainage or no surrounding erythema. Hypertension, follow-up  BP Readings from Last 3 Encounters:  11/04/20 (!) 182/67  05/28/20 (!) 142/62  03/27/20 132/60   Wt Readings from Last 3 Encounters:  11/04/20 133 lb (60.3 kg)  05/28/20 129 lb 9.6 oz (58.8 kg)  03/27/20 127 lb (57.6 kg)     She was last seen for hypertension 6 months ago.  BP at that visit was 142/62. Management since that visit includes; on HCTZ. She reports good compliance with treatment. She is not having side effects. none She is not exercising. She is adherent to low salt diet.   Outside blood pressures are not checking.  She does not smoke.  Use of agents associated with hypertension: none.   --------------------------------------------------------------------      Medications: Outpatient Medications Prior to Visit  Medication Sig  . alendronate (FOSAMAX) 70  MG tablet Take 1 tablet (70 mg total) by mouth every 7 (seven) days. Take with a full glass of water on an empty stomach.  Marland Kitchen aspirin 81 MG tablet Take 81 mg by mouth daily.   . Cholecalciferol (VITAMIN D3) 10 MCG (400 UNIT) tablet Take by mouth daily. Dose unknown  . hydrochlorothiazide (HYDRODIURIL) 25 MG tablet TAKE 1 TABLET BY MOUTH DAILY (Patient not taking: Reported on 11/04/2020)  . MAGNESIUM PO Take by mouth daily. Unsure dose  . Melatonin-Pyridoxine (MELATIN PO) Take by mouth at bedtime as needed. Unsure dose  . moxifloxacin (VIGAMOX) 0.5 % ophthalmic solution Place 1 drop into the left eye daily.  (Patient not taking: No sig reported)  . Multiple Vitamins tablet Take 1 tablet by mouth daily.   Marland Kitchen triamcinolone cream (KENALOG) 0.1 %  (Patient not taking: No sig reported)  . vitamin B-12 (CYANOCOBALAMIN) 1000 MCG tablet Take 1,000 mcg by mouth daily.   No facility-administered medications prior to visit.    Review of Systems  Constitutional: Negative for activity change and fatigue.  Respiratory: Negative for cough and shortness of breath.   Cardiovascular: Negative for chest pain, palpitations and leg swelling.  Musculoskeletal: Negative for arthralgias and myalgias.  Neurological: Negative for dizziness, weakness, light-headedness and headaches.       Objective    There were no vitals taken for this visit.    Physical Exam Vitals reviewed.  Constitutional:      Appearance: She  is well-developed.  HENT:     Head: Normocephalic and atraumatic.  Eyes:     Conjunctiva/sclera: Conjunctivae normal.     Pupils: Pupils are equal, round, and reactive to light.  Cardiovascular:     Rate and Rhythm: Normal rate and regular rhythm.     Heart sounds: Normal heart sounds.  Pulmonary:     Effort: Pulmonary effort is normal.     Breath sounds: Normal breath sounds.  Abdominal:     Palpations: Abdomen is soft.  Musculoskeletal:     Cervical back: Normal range of motion and neck  supple.  Skin:    General: Skin is warm and dry.     Comments: Very fair skin.  She has appears to be some hypertrophic scarring of the left lower axilla and is mildly tender to the touch.  No signs of infection. There is also blister/vesicle that is about 2 mm in the left anterior cheek with no drainage and no surrounding erythema.  Neurological:     General: No focal deficit present.     Mental Status: She is alert and oriented to person, place, and time.     Deep Tendon Reflexes: Reflexes are normal and symmetric.  Psychiatric:        Mood and Affect: Mood normal.        Behavior: Behavior normal.        Thought Content: Thought content normal.        Judgment: Judgment normal.       No results found for any visits on 11/25/20.  Assessment & Plan       No follow-ups on file.      1. Essential hypertension Start amlodipine 5 mg daily.  Follow-up in a couple months.  Will use hot compresses on the facial lesion.  For to dermatology for this. - amLODipine (NORVASC) 5 MG tablet; Take 1 tablet (5 mg total) by mouth daily.  Dispense: 30 tablet; Refill: 3  2. [place] Try topical mometasone.  She has appointment in a couple weeks with dermatology. - mometasone (ELOCON) 0.1 % cream; Apply 1 application topically daily.  Dispense: 45 g; Refill: 0  3. Renal insufficiency Follow-up renal function when appropriate  4. Osteopenia, unspecified location Follow-up BMD when appropriate in late 2023    [name], MD  [facility] [phone] (phone) [phone] (fax)  [place]

## 2020-12-17 ENCOUNTER — Other Ambulatory Visit: Payer: Self-pay

## 2020-12-17 ENCOUNTER — Encounter: Payer: Self-pay | Admitting: Dermatology

## 2020-12-17 ENCOUNTER — Ambulatory Visit: Payer: PPO | Admitting: Dermatology

## 2020-12-17 DIAGNOSIS — C44320 Squamous cell carcinoma of skin of unspecified parts of face: Secondary | ICD-10-CM

## 2020-12-17 DIAGNOSIS — C44329 Squamous cell carcinoma of skin of other parts of face: Secondary | ICD-10-CM

## 2020-12-17 DIAGNOSIS — L719 Rosacea, unspecified: Secondary | ICD-10-CM | POA: Diagnosis not present

## 2020-12-17 DIAGNOSIS — L570 Actinic keratosis: Secondary | ICD-10-CM | POA: Diagnosis not present

## 2020-12-17 DIAGNOSIS — L578 Other skin changes due to chronic exposure to nonionizing radiation: Secondary | ICD-10-CM

## 2020-12-17 DIAGNOSIS — D492 Neoplasm of unspecified behavior of bone, soft tissue, and skin: Secondary | ICD-10-CM

## 2020-12-17 DIAGNOSIS — C4492 Squamous cell carcinoma of skin, unspecified: Secondary | ICD-10-CM

## 2020-12-17 HISTORY — DX: Squamous cell carcinoma of skin, unspecified: C44.92

## 2020-12-17 MED ORDER — DOXYCYCLINE HYCLATE 100 MG PO CAPS: 100.0000 mg | ORAL_CAPSULE | Freq: Every day | ORAL | 1 refills | Status: DC

## 2020-12-17 MED ORDER — METRONIDAZOLE 0.75 % EX CREA: TOPICAL_CREAM | Freq: Two times a day (BID) | CUTANEOUS | 4 refills | Status: AC

## 2020-12-17 NOTE — Patient Instructions (Addendum)
Start Metronidazole 0.75% cream twice daily to face  Take Doxycycline 100mg  1 po once daily with food as needed for flares  Wound Care Instructions  1. Cleanse wound gently with soap and water once a day then pat dry with clean gauze. Apply a thing coat of Petrolatum (petroleum jelly, "Vaseline") over the wound (unless you have an allergy to this). We recommend that you use a new, sterile tube of Vaseline. Do not pick or remove scabs. Do not remove the yellow or white "healing tissue" from the base of the wound.  2. Cover the wound with fresh, clean, nonstick gauze and secure with paper tape. You may use Band-Aids in place of gauze and tape if the would is small enough, but would recommend trimming much of the tape off as there is often too much. Sometimes Band-Aids can irritate the skin.  3. You should call the office for your biopsy report after 1 week if you have not already been contacted.  4. If you experience any problems, such as abnormal amounts of bleeding, swelling, significant bruising, significant pain, or evidence of infection, please call the office immediately.  5. FOR ADULT SURGERY PATIENTS: If you need something for pain relief you may take 1 extra strength Tylenol (acetaminophen) AND 2 Ibuprofen (200mg  each) together every 4 hours as needed for pain. (do not take these if you are allergic to them or if you have a reason you should not take them.) Typically, you may only need pain medication for 1 to 3 days.    Cryotherapy  Cryotherapy is the treatment of lesions with the application of a cold substance.  In most cases, liquid nitrogen is used to destroy the lesion(s).  Liquid nitrogen is so cold, -196 Celsius, it feels like it is burning when it is applied.  After treatment with liquid nitrogen, there may be some burning sensation or pain that can last up to 24 hours.  The area may also be swollen and red.  The discomfort can be relieved with ibuprofen (Advil, Motrin),  acetaminophen (Extra Strength Tylenol), or similar pain relief medication.  Within 24 to 48 hours, a blister may form.  Occasionally, these blisters will become filled with blood and become very dark.  This is no cause for concern.  The blister will gradually dry up over a period of several days, eventually separating from the healing skin below in about one to two weeks.  The surrounding skin will become red and the area may become itchy.  This is all part of the normal healing process.  Occasionally, the crusts will last as long as four weeks when certain deeper spots on the skin are treated.  Sometimes a permanent white mark or scar will be left after healing.  You may continue all of your normal activities as long as they do not cause pain in the treated areas.  It is okay to get the area wet.  After therapy or if the blister is still intact - You may treat it like normal skin.  Covering the area with a bandage may offer some comfort and protection from trauma but is not absolutely necessary.  If the blister is uncomfortable - Clean a small needle with rubbing alcohol, then gently make a small hole in the side of the blister to drain the blister fluid.  This often gives immediate relief.  Do not remove the blister roof, as the blister aids in healing.  In time it will fall off on its own.  Once the blister roof falls off or a sore forms -  . Clean the blister sites with soap and water.  Rinse the area and pat dry.  Do not force off the blister roof or crust. . Apply an antibiotic ointment such as Polysporin or Bacitracin. . A bandage may be applied loosely over the blister until it is healed if desired. . Call our office if you are concerned it may be infected.  Some redness, itching and oozing is part of the normal healing process.  Signs of infection include increasing redness, increasing pain, swelling, heat, or yellow discharge.   If you have any questions or concerns for your doctor,  please call our main line at [phone] and press option 4 to reach your doctor's medical assistant. If no one answers, please leave a voicemail as directed and we will return your call as soon as possible. Messages left after 4 pm will be answered the following business day.   You may also send [place] a message via [place]. We typically respond to MyChart messages within 1-2 business days.  For prescription refills, please ask your pharmacy to contact our office. Our fax number is [phone].  If you have an urgent issue when the clinic is closed that cannot wait until the next business day, you can page your doctor at the number below.    Please note that while we do our best to be available for urgent issues outside of office hours, we are not available 24/7.   If you have an urgent issue and are unable to reach [place], you may choose to seek medical care at your doctor's office, retail clinic, urgent care center, or emergency room.  If you have a medical emergency, please immediately call 911 or go to the emergency department.  Pager Numbers  - Dr. Nehemiah Massed: [phone]  - Dr. Laurence Ferrari: [phone]  - Dr. Nicole Kindred: [phone]  In the event of inclement weather, please call our main line at [phone] for an update on the status of any delays or closures.  Dermatology Medication Tips: Please keep the boxes that topical medications come in in order to help keep track of the instructions about where and how to use these. Pharmacies typically print the medication instructions only on the boxes and not directly on the medication tubes.   If your medication is too expensive, please contact our office at [phone] option 4 or send [place] a message through [place].   We are unable to tell what your co-pay for medications will be in advance as this is different depending on your insurance coverage. However, we may be able to find a substitute medication at lower cost or fill out paperwork to get  insurance to cover a needed medication.   If a prior authorization is required to get your medication covered by your insurance company, please allow [place] 1-2 business days to complete this process.  Drug prices often vary depending on where the prescription is filled and some pharmacies may offer cheaper prices.  The website www.goodrx.com contains coupons for medications through different pharmacies. The prices here do not account for what the cost may be with help from insurance (it may be cheaper with your insurance), but the website can give you the price if you did not use any insurance.  - You can print the associated coupon and take it with your prescription to the pharmacy.  - You may also stop by our office during regular business hours and pick up a  GoodRx coupon card.  - If you need your prescription sent electronically to a different pharmacy, notify our office through [facility] or by phone at [phone] option 4.

## 2020-12-17 NOTE — Progress Notes (Signed)
Follow-Up Visit   Subjective  Felicia Weaver is a 87 y.o. female who presents for the following: Skin Problem (Check spots on face. Would like lesion on left cheek treated today. Sometimes painful. Dur: few weeks. Raised. Pink. ).  Daughter, [name], with patient.  The following portions of the chart were reviewed this encounter and updated as appropriate:      Review of Systems: No other skin or systemic complaints except as noted in HPI or Assessment and Plan.  Objective  Well appearing patient in no apparent distress; mood and affect are within normal limits.  A focused examination was performed including head, including the scalp, face, neck, nose, ears, eyelids, and lips. Relevant physical exam findings are noted in the Assessment and Plan.  Objective  Left Cheek: 1.1cm pink smooth papule     Objective  Cheeks, perioral: Mid face erythema with telangiectasias, scattered inflammatory papules.   Objective  Right Nasal dorsum x2, right hand dorsum x3 (5): Erythematous thin papules/macules with gritty scale.   Assessment & Plan  Neoplasm of skin Left Cheek  Skin / nail biopsy Type of biopsy: tangential   Informed consent: discussed and consent obtained   Anesthesia: the lesion was anesthetized in a standard fashion   Anesthesia comment:  Area prepped with alcohol Anesthetic:  1% lidocaine w/ epinephrine 1-100,000 buffered w/ 8.4% NaHCO3 Instrument used: flexible razor blade   Hemostasis achieved with: pressure, aluminum chloride and electrodesiccation   Outcome: patient tolerated procedure well    Destruction of lesion  Destruction method: electrodesiccation and curettage   Timeout:  patient name, date of birth, surgical site, and procedure verified Anesthesia: the lesion was anesthetized in a standard fashion   Anesthetic:  1% lidocaine w/ epinephrine 1-100,000 buffered w/ 8.4% NaHCO3 Curettage performed in three different directions: Yes    Electrodesiccation performed over the curetted area: Yes   Lesion length (cm):  1.1 Lesion width (cm):  1.1 Margin per side (cm):  0.1 Final wound size (cm):  1.3 Hemostasis achieved with:  pressure, aluminum chloride and electrodesiccation Outcome: patient tolerated procedure well with no complications   Post-procedure details: wound care instructions given   Additional details:  Mupirocin ointment and Bandaid applied    Specimen 1 - Surgical pathology Differential Diagnosis: Cyst vs SCC/BCC  Check Margins: No 1.1cm pink smooth papule  Rosacea Cheeks, perioral  Start Metronidazole 0.75% cream BID to face  Take Doxycycline 100mg  1 po QD with food PRN flares  Rosacea is a chronic progressive skin condition usually affecting the face of adults, causing redness and/or acne bumps. It is treatable but not curable. It sometimes affects the eyes (ocular rosacea) as well. It may respond to topical and/or systemic medication and can flare with stress, sun exposure, alcohol, exercise and some foods.  Daily application of broad spectrum spf 30+ sunscreen to face is recommended to reduce flares.   Doxycycline should be taken with food to prevent nausea. Do not lay down for 30 minutes after taking. Be cautious with sun exposure and use good sun protection while on this medication. Pregnant women should not take this medication.    metroNIDAZOLE (METROCREAM) 0.75 % cream - Cheeks, perioral  doxycycline (VIBRAMYCIN) 100 MG capsule - Cheeks, perioral  AK (actinic keratosis) (5) Right Nasal dorsum x2, right hand dorsum x3  Destruction of lesion - Right Nasal dorsum x2, right hand dorsum x3  Destruction method: cryotherapy   Informed consent: discussed and consent obtained   Lesion destroyed using liquid  nitrogen: Yes   Region frozen until ice ball extended beyond lesion: Yes   Outcome: patient tolerated procedure well with no complications   Post-procedure details: wound care instructions  given    Actinic Damage - chronic, secondary to cumulative UV radiation exposure/sun exposure over time - diffuse scaly erythematous macules with underlying dyspigmentation - Recommend daily broad spectrum sunscreen SPF 30+ to sun-exposed areas, reapply every 2 hours as needed.  - Recommend staying in the shade or wearing long sleeves, sun glasses (UVA+UVB protection) and wide brim hats (4-inch brim around the entire circumference of the hat). - Call for new or changing lesions.  Return if symptoms worsen or fail to improve.   I, Emelia Salisbury, CMA, am acting as scribe for Brendolyn Patty, MD.  Documentation: I have reviewed the above documentation for accuracy and completeness, and I agree with the above.  Brendolyn Patty MD

## 2020-12-22 DIAGNOSIS — H401132 Primary open-angle glaucoma, bilateral, moderate stage: Secondary | ICD-10-CM | POA: Diagnosis not present

## 2020-12-29 ENCOUNTER — Telehealth: Payer: Self-pay

## 2020-12-29 NOTE — Telephone Encounter (Signed)
-----   Message from Brendolyn Patty, MD sent at 12/29/2020 10:44 AM EDT ----- Skin , left cheek SQUAMOUS CELL CARCINOMA, KERATOACANTHOMA TYPE, INFLAMED  SCC skin cancer- treated with EDC at time of biopsy

## 2020-12-29 NOTE — Telephone Encounter (Signed)
Advised patient's daughter, [name], biopsy was SCC and was treated with EDC at the same time. Follow-up scheduled for 07/07/2021 2:15pm.

## 2021-02-20 ENCOUNTER — Other Ambulatory Visit: Payer: Self-pay | Admitting: Family Medicine

## 2021-02-20 DIAGNOSIS — I10 Essential (primary) hypertension: Secondary | ICD-10-CM

## 2021-02-20 NOTE — Progress Notes (Signed)
Established patient visit   Patient: Felicia Weaver   DOB: Nov 18, 1924   87 y.o. Female  MRN: [id number] Visit Date: 02/23/2021  Today's healthcare provider: [name], MD   No chief complaint on file.  Subjective    HPI  Patient comes in today for follow-up.  She is doing well.  Her daughter brings her in.  She has no complaints.  She has had no falls. Hypertension, follow-up  BP Readings from Last 3 Encounters:  11/25/20 (!) 178/70  11/04/20 (!) 182/67  05/28/20 (!) 142/62   Wt Readings from Last 3 Encounters:  11/25/20 131 lb (59.4 kg)  11/04/20 133 lb (60.3 kg)  05/28/20 129 lb 9.6 oz (58.8 kg)     She was last seen for hypertension 3 months ago.  BP at that visit was 178/70. Management since that visit includes starting amlodipine 5mg  daily.  She reports good compliance with treatment. She is not having side effects.  She is following a Regular diet. She is not exercising. She does not smoke.  Use of agents associated with hypertension: none.   Outside blood pressures are . Symptoms: No chest pain No chest pressure  No palpitations No syncope  No dyspnea No orthopnea  No paroxysmal nocturnal dyspnea No lower extremity edema   Pertinent labs: Lab Results  Component Value Date   CHOL 225 (H) 05/16/2019   HDL 68 05/16/2019   LDLCALC 142 (H) 05/16/2019   TRIG 88 05/16/2019   CHOLHDL 3.3 05/16/2019   Lab Results  Component Value Date   NA 142 05/28/2020   K 4.8 05/28/2020   CREATININE 1.44 (H) 05/28/2020   GFRNONAA 31 (L) 05/28/2020   GFRAA 36 (L) 05/28/2020   GLUCOSE 99 05/28/2020     The ASCVD Risk score (Goff DC Jr., et al., 2013) failed to calculate for the following reasons:   The 2013 ASCVD risk score is only valid for ages 93 to 8       Medications: Outpatient Medications Prior to Visit  Medication Sig   alendronate (FOSAMAX) 70 MG tablet Take 1 tablet (70 mg total) by mouth every 7 (seven) days. Take with a full glass of  water on an empty stomach.   amLODipine (NORVASC) 5 MG tablet TAKE ONE TABLET BY MOUTH EVERY DAY   aspirin 81 MG tablet Take 81 mg by mouth daily.    Cholecalciferol (VITAMIN D3) 10 MCG (400 UNIT) tablet Take by mouth daily. Dose unknown   doxycycline (VIBRAMYCIN) 100 MG capsule Take 1 capsule (100 mg total) by mouth daily. Take with food   hydrochlorothiazide (HYDRODIURIL) 25 MG tablet TAKE 1 TABLET BY MOUTH DAILY   MAGNESIUM PO Take by mouth daily. Unsure dose   Melatonin-Pyridoxine (MELATIN PO) Take by mouth at bedtime as needed. Unsure dose   metroNIDAZOLE (METROCREAM) 0.75 % cream Apply topically 2 (two) times daily.   mometasone (ELOCON) 0.1 % cream Apply 1 application topically daily.   moxifloxacin (VIGAMOX) 0.5 % ophthalmic solution Place 1 drop into the left eye daily.   Multiple Vitamins tablet Take 1 tablet by mouth daily.    triamcinolone cream (KENALOG) 0.1 %    vitamin B-12 (CYANOCOBALAMIN) 1000 MCG tablet Take 1,000 mcg by mouth daily.   No facility-administered medications prior to visit.    Review of Systems  Constitutional:  Negative for activity change and fatigue.  Respiratory:  Negative for cough and shortness of breath.   Cardiovascular:  Negative for chest pain,  palpitations and leg swelling.  Skin:  Negative for color change, pallor, rash and wound.  Neurological:  Negative for dizziness, light-headedness and headaches.       Objective    There were no vitals taken for this visit. BP Readings from Last 3 Encounters:  02/23/21 140/66  11/25/20 (!) 178/70  11/04/20 (!) 182/67   Wt Readings from Last 3 Encounters:  02/23/21 129 lb (58.5 kg)  11/25/20 131 lb (59.4 kg)  11/04/20 133 lb (60.3 kg)       Physical Exam Vitals reviewed.  Constitutional:      Appearance: She is well-developed.  HENT:     Head: Normocephalic and atraumatic.  Eyes:     Conjunctiva/sclera: Conjunctivae normal.     Pupils: Pupils are equal, round, and reactive to light.   Cardiovascular:     Rate and Rhythm: Normal rate and regular rhythm.     Heart sounds: Normal heart sounds.  Pulmonary:     Effort: Pulmonary effort is normal.     Breath sounds: Normal breath sounds.  Abdominal:     Palpations: Abdomen is soft.  Musculoskeletal:     Cervical back: Normal range of motion and neck supple.  Skin:    General: Skin is warm and dry.     Comments: Very fair skin.  Neurological:     General: No focal deficit present.     Mental Status: She is alert and oriented to person, place, and time.     Deep Tendon Reflexes: Reflexes are normal and symmetric.  Psychiatric:        Mood and Affect: Mood normal.        Behavior: Behavior normal.        Thought Content: Thought content normal.        Judgment: Judgment normal.      No results found for any visits on 02/23/21.  Assessment & Plan     1. Primary hypertension Good control on HCTZ and amlodipine.  2. Age-related osteoporosis without current pathological fracture On Fosamax weekly for another 3 or 4 years.   No follow-ups on file.      I, [name], MD, have reviewed all documentation for this visit. The documentation on 02/26/21 for the exam, diagnosis, procedures, and orders are all accurate and complete.    Shahara Hartsfield Cranford Mon, MD  [facility] [phone] (phone) [phone] (fax)  [place]

## 2021-02-23 ENCOUNTER — Encounter: Payer: Self-pay | Admitting: Family Medicine

## 2021-02-23 ENCOUNTER — Other Ambulatory Visit: Payer: Self-pay

## 2021-02-23 ENCOUNTER — Ambulatory Visit (INDEPENDENT_AMBULATORY_CARE_PROVIDER_SITE_OTHER): Payer: PPO | Admitting: Family Medicine

## 2021-02-23 VITALS — BP 140/66 | HR 62 | Temp 98.0°F | Resp 16 | Wt 129.0 lb

## 2021-02-23 DIAGNOSIS — I10 Essential (primary) hypertension: Secondary | ICD-10-CM | POA: Diagnosis not present

## 2021-02-23 DIAGNOSIS — M810 Age-related osteoporosis without current pathological fracture: Secondary | ICD-10-CM | POA: Diagnosis not present

## 2021-02-23 DIAGNOSIS — H109 Unspecified conjunctivitis: Secondary | ICD-10-CM | POA: Diagnosis not present

## 2021-04-24 ENCOUNTER — Other Ambulatory Visit: Payer: Self-pay | Admitting: Family Medicine

## 2021-04-24 DIAGNOSIS — I10 Essential (primary) hypertension: Secondary | ICD-10-CM

## 2021-04-24 DIAGNOSIS — M810 Age-related osteoporosis without current pathological fracture: Secondary | ICD-10-CM

## 2021-05-09 ENCOUNTER — Telehealth: Payer: Self-pay

## 2021-05-09 NOTE — Telephone Encounter (Signed)
Lft vm with daughter asking to call me ay [phone] so we can schedule AWV, Subsequent. 9last AWV 02/2020).

## 2021-06-22 ENCOUNTER — Ambulatory Visit: Payer: Self-pay | Admitting: Family Medicine

## 2021-06-29 ENCOUNTER — Other Ambulatory Visit: Payer: Self-pay

## 2021-06-29 ENCOUNTER — Encounter: Payer: Self-pay | Admitting: Family Medicine

## 2021-06-29 ENCOUNTER — Ambulatory Visit (INDEPENDENT_AMBULATORY_CARE_PROVIDER_SITE_OTHER): Payer: PPO | Admitting: Family Medicine

## 2021-06-29 VITALS — BP 155/76 | HR 67 | Temp 98.0°F | Resp 18 | Ht 63.0 in | Wt 127.0 lb

## 2021-06-29 DIAGNOSIS — N289 Disorder of kidney and ureter, unspecified: Secondary | ICD-10-CM

## 2021-06-29 DIAGNOSIS — E039 Hypothyroidism, unspecified: Secondary | ICD-10-CM

## 2021-06-29 DIAGNOSIS — H179 Unspecified corneal scar and opacity: Secondary | ICD-10-CM | POA: Diagnosis not present

## 2021-06-29 DIAGNOSIS — T148XXA Other injury of unspecified body region, initial encounter: Secondary | ICD-10-CM | POA: Diagnosis not present

## 2021-06-29 DIAGNOSIS — I10 Essential (primary) hypertension: Secondary | ICD-10-CM

## 2021-06-29 DIAGNOSIS — Z23 Encounter for immunization: Secondary | ICD-10-CM

## 2021-06-29 DIAGNOSIS — M810 Age-related osteoporosis without current pathological fracture: Secondary | ICD-10-CM | POA: Diagnosis not present

## 2021-06-29 NOTE — Patient Instructions (Signed)
GET OMRON OR [employer]ELCH [employer] BLOOD PRESSURE CUFF FOR HOME. ALSO GET SHINGLES VACCINE AT PHARMACY.

## 2021-06-29 NOTE — Progress Notes (Signed)
I,[name],acting as a scribe for Felicia Durie, MD.,have documented all relevant documentation on the behalf of Felicia Durie, MD,as directed by  Felicia Durie, MD while in the presence of Felicia Durie, MD.  Established patient visit   Patient: Felicia Weaver   DOB: 01/02/1925   87 y.o. Female  MRN: [id number] Visit Date: 06/29/2021  Today's healthcare provider: [name], MD   Chief Complaint  Patient presents with   Follow-up   Hypertension   Subjective    HPI  Patient comes in today for follow-up.  He is feeling well. She does have a small puncture wound to her left forearm. Hypertension, follow-up  BP Readings from Last 3 Encounters:  06/29/21 (!) 155/76  02/23/21 140/66  11/25/20 (!) 178/70   Wt Readings from Last 3 Encounters:  06/29/21 127 lb (57.6 kg)  02/23/21 129 lb (58.5 kg)  11/25/20 131 lb (59.4 kg)     She was last seen for hypertension 4 months ago.  BP at that visit was 140/66. Management since that visit includes; Good control on HCTZ and amlodipine. She reports good compliance with treatment. She is not having side effects. none She is not exercising. She is adherent to low salt diet.   Outside blood pressures are not checking.  She does not smoke.  Use of agents associated with hypertension: none.   ---------------------------------------------------------------------------------------------------     Medications: Outpatient Medications Prior to Visit  Medication Sig   alendronate (FOSAMAX) 70 MG tablet TAKE 1 TABLET EVERY 7 DAYS WITH A FULL GLASS OF WATER ON AN EMPTY STOMACH DO NOT LIE DOWN FOR AT LEAST 30 MIN   amLODipine (NORVASC) 5 MG tablet TAKE ONE TABLET BY MOUTH EVERY DAY   aspirin 81 MG tablet Take 81 mg by mouth daily.    B Complex Vitamins (B COMPLEX-B12 PO) Take by mouth.   Cholecalciferol (VITAMIN D3) 10 MCG (400 UNIT) tablet Take by mouth daily. Dose unknown   MAGNESIUM PO Take by mouth  daily. Unsure dose   Melatonin-Pyridoxine (MELATIN PO) Take by mouth at bedtime as needed. Unsure dose   metroNIDAZOLE (METROCREAM) 0.75 % cream Apply topically 2 (two) times daily.   mometasone (ELOCON) 0.1 % cream Apply 1 application topically daily.   Multiple Vitamins tablet Take 1 tablet by mouth daily.    triamcinolone cream (KENALOG) 0.1 %    [DISCONTINUED] doxycycline (VIBRAMYCIN) 100 MG capsule Take 1 capsule (100 mg total) by mouth daily. Take with food (Patient not taking: No sig reported)   [DISCONTINUED] hydrochlorothiazide (HYDRODIURIL) 25 MG tablet TAKE 1 TABLET BY MOUTH DAILY (Patient not taking: Reported on 06/29/2021)   [DISCONTINUED] moxifloxacin (VIGAMOX) 0.5 % ophthalmic solution Place 1 drop into the left eye daily. (Patient not taking: Reported on 06/29/2021)   [DISCONTINUED] vitamin B-12 (CYANOCOBALAMIN) 1000 MCG tablet Take 1,000 mcg by mouth daily.   No facility-administered medications prior to visit.    Review of Systems  Constitutional:  Negative for appetite change, chills, fatigue and fever.  Respiratory:  Negative for chest tightness and shortness of breath.   Cardiovascular:  Negative for chest pain and palpitations.  Gastrointestinal:  Negative for abdominal pain, nausea and vomiting.  Neurological:  Negative for dizziness and weakness.       Objective    BP (!) 155/76 (BP Location: Left Arm, Patient Position: Sitting, Cuff Size: Normal)   Pulse 67   Temp 98 F (36.7 C) (Temporal)   Resp 18  Ht 5\' 3"  (1.6 m)   Wt 127 lb (57.6 kg)   SpO2 98%   BMI 22.50 kg/m  BP Readings from Last 3 Encounters:  06/29/21 (!) 155/76  02/23/21 140/66  11/25/20 (!) 178/70   Wt Readings from Last 3 Encounters:  06/29/21 127 lb (57.6 kg)  02/23/21 129 lb (58.5 kg)  11/25/20 131 lb (59.4 kg)      Physical Exam Vitals reviewed.  Constitutional:      Appearance: She is well-developed.  HENT:     Head: Normocephalic and atraumatic.  Eyes:      Conjunctiva/sclera: Conjunctivae normal.     Pupils: Pupils are equal, round, and reactive to light.  Cardiovascular:     Rate and Rhythm: Normal rate and regular rhythm.     Heart sounds: Normal heart sounds.  Pulmonary:     Effort: Pulmonary effort is normal.     Breath sounds: Normal breath sounds.  Abdominal:     Palpations: Abdomen is soft.  Musculoskeletal:     Cervical back: Normal range of motion and neck supple.  Skin:    General: Skin is warm and dry.     Comments: Very fair skin.  Neurological:     General: No focal deficit present.     Mental Status: She is alert and oriented to person, place, and time.     Deep Tendon Reflexes: Reflexes are normal and symmetric.  Psychiatric:        Mood and Affect: Mood normal.        Behavior: Behavior normal.        Thought Content: Thought content normal.        Judgment: Judgment normal.      No results found for any visits on 06/29/21.  Assessment & Plan     1. Essential hypertension Get Omron or [employer] [place] blood pressure cuff for home use.  Bring in numbers on next visit  2. Need for influenza vaccination  - Flu Vaccine QUAD High Dose(Fluad)  3. Puncture wound Not infected.  Tetanus shot given - Tdap vaccine greater than or equal to 7yo IM  4. Age-related osteoporosis without current pathological fracture On alendronate for a few more years  5. Renal insufficiency With nonsteroidal  6. Adult hypothyroidism     Return in about 6 months (around 12/28/2021).      I, Felicia Durie, MD, have reviewed all documentation for this visit. The documentation on 07/05/21 for the exam, diagnosis, procedures, and orders are all accurate and complete.    An Schnabel Cranford Mon, MD  [facility] [phone] (phone) [phone] (fax)  [place]

## 2021-07-07 ENCOUNTER — Ambulatory Visit: Payer: PPO | Admitting: Dermatology

## 2021-07-07 ENCOUNTER — Other Ambulatory Visit: Payer: Self-pay

## 2021-07-07 DIAGNOSIS — L570 Actinic keratosis: Secondary | ICD-10-CM

## 2021-07-07 DIAGNOSIS — S00412A Abrasion of left ear, initial encounter: Secondary | ICD-10-CM

## 2021-07-07 DIAGNOSIS — T148XXA Other injury of unspecified body region, initial encounter: Secondary | ICD-10-CM

## 2021-07-07 DIAGNOSIS — L719 Rosacea, unspecified: Secondary | ICD-10-CM

## 2021-07-07 DIAGNOSIS — L578 Other skin changes due to chronic exposure to nonionizing radiation: Secondary | ICD-10-CM

## 2021-07-07 DIAGNOSIS — Z85828 Personal history of other malignant neoplasm of skin: Secondary | ICD-10-CM | POA: Diagnosis not present

## 2021-07-07 NOTE — Patient Instructions (Addendum)
Metronidazole 0.75% Cream - Apply to red bumps on nose 2 times a day (Rosacea)   Rosacea  What is rosacea? Rosacea (say: ro-zay-sha) is a common skin disease that usually begins as a trend of flushing or blushing easily.  As rosacea progresses, a persistent redness in the center of the face will develop and may gradually spread beyond the nose and cheeks to the forehead and chin.  In some cases, the ears, chest, and back could be affected.  Rosacea may appear as tiny blood vessels or small red bumps that occur in crops.  Frequently they can contain pus, and are called "pustules".  If the bumps do not contain pus, they are referred to as "papules".  Rarely, in prolonged, untreated cases of rosacea, the oil glands of the nose and cheeks may become permanently enlarged.  This is called rhinophyma, and is seen more frequently in men.  Signs and Risks In its beginning stages, rosacea tends to come and go, which makes it difficult to recognize.  It can start as intermittent flushing of the face.  Eventually, blood vessels may become permanently visible.  Pustules and papules can appear, but can be mistaken for adult acne.  People of all races, ages, genders and ethnic groups are at risk of developing rosacea.  However, it is more common in women (especially around menopause) and adults with fair skin between the ages of 21 and 29.  Treatment Dermatologists typically recommend a combination of treatments to effectively manage rosacea.  Treatment can improve symptoms and may stop the progression of the rosacea.  Treatment may involve both topical and oral medications.  The tetracycline antibiotics are often used for their anti-inflammatory effect; however, because of the possibility of developing antibiotic resistance, they should not be used long term at full dose.  For dilated blood vessels the options include electrodessication (uses electric current through a small needle), laser treatment, and cosmetics to  hide the redness.   With all forms of treatment, improvement is a slow process, and patients may not see any results for the first 3-4 weeks.  It is very important to avoid the sun and other triggers.  Patients must wear sunscreen daily.  Skin Care Instructions: Cleanse the skin with a mild soap such as CeraVe cleanser, Cetaphil cleanser, or Dove soap once or twice daily as needed. Moisturize with Eucerin Redness Relief Daily Perfecting Lotion (has a subtle green tint), CeraVe Moisturizing Cream, or Oil of Olay Daily Moisturizer with sunscreen every morning and/or night as recommended. Makeup should be "non-comedogenic" (won't clog pores) and be labeled "for sensitive skin". Good choices for cosmetics are: Neutrogena, Almay, and Physician's Formula.  Any product with a green tint tends to offset a red complexion. If your eyes are dry and irritated, use artificial tears 2-3 times per day and cleanse the eyelids daily with baby shampoo.  Have your eyes examined at least every 2 years.  Be sure to tell your eye doctor that you have rosacea. Alcoholic beverages tend to cause flushing of the skin, and may make rosacea worse. Always wear sunscreen, protect your skin from extreme hot and cold temperatures, and avoid spicy foods, hot drinks, and mechanical irritation such as rubbing, scrubbing, or massaging the face.  Avoid harsh skin cleansers, cleansing masks, astringents, and exfoliation. If a particular product burns or makes your face feel tight, then it is likely to flare your rosacea. If you are having difficulty finding a sunscreen that you can tolerate, you may try switching  to a chemical-free sunscreen.  These are ones whose active ingredient is zinc oxide or titanium dioxide only.  They should also be fragrance free, non-comedogenic, and labeled for sensitive skin. Rosacea triggers may vary from person to person.  There are a variety of foods that have been reported to trigger rosacea.  Some patients  find that keeping a diary of what they were doing when they flared helps them avoid triggers.  If you have any questions or concerns for your doctor, please call our main line at [phone] and press option 4 to reach your doctor's medical assistant. If no one answers, please leave a voicemail as directed and we will return your call as soon as possible. Messages left after 4 pm will be answered the following business day.   You may also send [place] a message via [place]. We typically respond to MyChart messages within 1-2 business days.  For prescription refills, please ask your pharmacy to contact our office. Our fax number is [phone].  If you have an urgent issue when the clinic is closed that cannot wait until the next business day, you can page your doctor at the number below.    Please note that while we do our best to be available for urgent issues outside of office hours, we are not available 24/7.   If you have an urgent issue and are unable to reach [place], you may choose to seek medical care at your doctor's office, retail clinic, urgent care center, or emergency room.  If you have a medical emergency, please immediately call 911 or go to the emergency department.  Pager Numbers  - Dr. Nehemiah Massed: [phone]  - Dr. Laurence Ferrari: [phone]  - Dr. Nicole Kindred: [phone]  In the event of inclement weather, please call our main line at [phone] for an update on the status of any delays or closures.  Dermatology Medication Tips: Please keep the boxes that topical medications come in in order to help keep track of the instructions about where and how to use these. Pharmacies typically print the medication instructions only on the boxes and not directly on the medication tubes.   If your medication is too expensive, please contact our office at [phone] option 4 or send [place] a message through [place].   We are unable to tell what your co-pay for medications will be in advance as  this is different depending on your insurance coverage. However, we may be able to find a substitute medication at lower cost or fill out paperwork to get insurance to cover a needed medication.   If a prior authorization is required to get your medication covered by your insurance company, please allow [place] 1-2 business days to complete this process.  Drug prices often vary depending on where the prescription is filled and some pharmacies may offer cheaper prices.  The website www.goodrx.com contains coupons for medications through different pharmacies. The prices here do not account for what the cost may be with help from insurance (it may be cheaper with your insurance), but the website can give you the price if you did not use any insurance.  - You can print the associated coupon and take it with your prescription to the pharmacy.  - You may also stop by our office during regular business hours and pick up a GoodRx coupon card.  - If you need your prescription sent electronically to a different pharmacy, notify our office through [facility] or by phone at [phone] option 4.

## 2021-07-07 NOTE — Progress Notes (Signed)
Follow-Up Visit   Subjective  Felicia Weaver is a 87 y.o. female who presents for the following: Follow-up (History of SCC, KA type, of the left cheek. Biopsy and [facility] 12/17/2020.) and Pink spots (Face, come and go. Patient would like checked. She has rosacea but has not been using metronidazole gel or taking doxycycline. ). Pt has memory problems and forgets to use meds.  Patient's daughter, Felicia Weaver, with patient today.    The following portions of the chart were reviewed this encounter and updated as appropriate:       Review of Systems:  No other skin or systemic complaints except as noted in HPI or Assessment and Plan.  Objective  Well appearing patient in no apparent distress; mood and affect are within normal limits.  A focused examination was performed including face. Relevant physical exam findings are noted in the Assessment and Plan.  Left Lower Cheek Well healed scar with no evidence of recurrence.   face Inflammatory papules on the cheeks, nose, left chin; mild erythema.   left lower ear helix Linear erosion mid ear antihelix in crease.  L nasal dorsum x 2 (2) Erythematous thin papules/macules with gritty scale.    Assessment & Plan   Actinic Damage - chronic, secondary to cumulative UV radiation exposure/sun exposure over time - diffuse scaly erythematous macules with underlying dyspigmentation - Recommend daily broad spectrum sunscreen SPF 30+ to sun-exposed areas, reapply every 2 hours as needed.  - Recommend staying in the shade or wearing long sleeves, sun glasses (UVA+UVB protection) and wide brim hats (4-inch brim around the entire circumference of the hat). - Call for new or changing lesions.  History of SCC (squamous cell carcinoma) of skin Left Lower Cheek  Clear. Observe for recurrence. Call clinic for new or changing lesions.  Recommend regular skin exams, daily broad-spectrum spf 30+ sunscreen use, and photoprotection.     Rosacea face  With flare, pt not using prescribed Rx  Rosacea is a chronic progressive skin condition usually affecting the face of adults, causing redness and/or acne bumps. It is treatable but not curable. It sometimes affects the eyes (ocular rosacea) as well. It may respond to topical and/or systemic medication and can flare with stress, sun exposure, alcohol, exercise and some foods.  Daily application of broad spectrum spf 30+ sunscreen to face is recommended to reduce flares.  Restart metronidazole 0.75% cream qd/bid face. Pt has refills.  Doxycycline prn flares  Related Medications metroNIDAZOLE (METROCREAM) 0.75 % cream Apply topically 2 (two) times daily.  Excoriation left lower ear helix  Recommend antibiotic ointment daily until healed.   AK (actinic keratosis) (2) L nasal dorsum x 2  Actinic keratoses are precancerous spots that appear secondary to cumulative UV radiation exposure/sun exposure over time. They are chronic with expected duration over 1 year. A portion of actinic keratoses will progress to squamous cell carcinoma of the skin. It is not possible to reliably predict which spots will progress to skin cancer and so treatment is recommended to prevent development of skin cancer.  Recommend daily broad spectrum sunscreen SPF 30+ to sun-exposed areas, reapply every 2 hours as needed.  Recommend staying in the shade or wearing long sleeves, sun glasses (UVA+UVB protection) and wide brim hats (4-inch brim around the entire circumference of the hat). Call for new or changing lesions.  Destruction of lesion - L nasal dorsum x 2  Destruction method: cryotherapy   Informed consent: discussed and consent obtained   Lesion destroyed  using liquid nitrogen: Yes   Region frozen until ice ball extended beyond lesion: Yes   Outcome: patient tolerated procedure well with no complications   Post-procedure details: wound care instructions given   Additional details:  Prior  to procedure, discussed risks of blister formation, small wound, skin dyspigmentation, or rare scar following cryotherapy. Recommend Vaseline ointment to treated areas while healing.   Return if symptoms worsen or fail to improve.  IJamesetta Orleans, CMA, am acting as scribe for Brendolyn Patty, MD .  Documentation: I have reviewed the above documentation for accuracy and completeness, and I agree with the above.  Brendolyn Patty MD

## 2021-08-05 ENCOUNTER — Ambulatory Visit: Payer: Self-pay | Admitting: *Deleted

## 2021-08-05 MED ORDER — NITROFURANTOIN MONOHYD MACRO 100 MG PO CAPS: 100.0000 mg | ORAL_CAPSULE | Freq: Two times a day (BID) | ORAL | 0 refills | Status: AC

## 2021-08-05 NOTE — Telephone Encounter (Signed)
I attempted to return the call to pt's daughter Pia Mau.   I left a voicemail for her to call the office back and any nurse can assist her.  Daughter had called in earlier today regarding her mother having a UTI.   They did a home test and it showed a positive UTI.   There are no appts available at [facility] so she was seeking advice what to do next.

## 2021-08-05 NOTE — Telephone Encounter (Signed)
Pt's daughter calling, states pt with some increased confusion x 2 weeks, "More mixed up past 2 days." Did home test for UTI, "Tested positive." States positive leukocytes, neg. nitrates. States only symptom is more confusion. Requesting antibiotic be called in "Since they just saw her in the office last week." Assured NT would route to practice for PCPs review and final disposition. Advised may need appt for antibiotics. Verbalizes understanding. Requesting CB ASAP "I'll like to take care of this as quickly as possible with the holiday."  Call received during lunch break. Please advise: CB# [phone]     Reason for Disposition  Urinating more frequently than usual (i.e., frequency)    Increased confusion, tested positive on home test for UTI  Answer Assessment - Initial Assessment Questions 1. SYMPTOM: "What's the main symptom you're concerned about?" (e.g., frequency, incontinence)     Confusion 2. ONSET: "When did the    start?"     Last 2  weeks  "More confused, not bad but more mixed up." 3. PAIN: "Is there any pain?" If Yes, ask: "How bad is it?" (Scale: 1-10; mild, moderate, severe)     no 4. CAUSE: "What do you think is causing the symptoms?"     UTI 5. OTHER SYMPTOMS: "Do you have any other symptoms?" (e.g., fever, flank pain, blood in urine, pain with urination)     "Worsening memory loss, acting a little more confused."  Protocols used: Urinary Symptoms-A-AH

## 2021-08-05 NOTE — Telephone Encounter (Signed)
Per Dr. Rosanna Randy, ok to send in prescription for Nitrofurantoin 100mg  twice daily for 5 days. Recommend cranberry juice and push fluids. OV if symptoms not improved. I called patient's daughter and advised her of this. She verbalized understanding.

## 2021-08-18 ENCOUNTER — Ambulatory Visit: Payer: Self-pay

## 2021-08-18 NOTE — Telephone Encounter (Signed)
Pt's daughter called back, states she is wondering if her mother needs to be brought in or if she is ok to wait and continue to monitor pt. She was prescribed Nitrofurantoin BID x 5 days and completed course on 08/10/21. Daughter says mother isnt showing any urinary symptoms and confusion has improved but still slightly off. She tested pts urine using a home test kit and showed leukocytes. Advised daughter that in elderly confusion can be 1st sign for UTI but since pt isnt showing any other symptoms home monitoring should be ok. If she feels like confusion worsens or retests pt's urine and positive for leukocytes or nitrates to call office back so we can schedule appt for pt. Care advice given and she verbalized understanding. No other questions/concerns noted.     Summary: urinary discomfort / rx request   The patient's daughter has called to share that the patient is continuing to experience previously discussed urinary discomfort   The patient was previously prescribed an antibiotic to help alleviate symptoms that worked but the prescription has ended   The patient's daughter would like to discuss this further when possible     Reason for Disposition  [1] Reasonable improvement on antibiotics AND [2] no fever  Answer Assessment - Initial Assessment Questions 1. ANTIBIOTIC: "What antibiotic are you taking?" "How many times per day?"     Nitrofurantoin $RemoveBeforeD'100mg'[id number]$  twice daily for 5 days 2. DURATION: "When was the antibiotic started?"     08/05/21 3. MAIN SYMPTOM: "What is the main symptom you are concerned about?"     Did home urine test strip that showed some positive leukocytes  4. FEVER: "Do you have a fever?" If Yes, ask: "What is it, how was it measured, and when did it start?"     No 5. OTHER SYMPTOMS: "Do you have any other symptoms?" (e.g., flank pain, vaginal discharge, blood in urine)     Slight confusion  Protocols used: Urinary Tract Infection on Antibiotic Follow-up Call -  [facility]

## 2021-09-18 DIAGNOSIS — M7541 Impingement syndrome of right shoulder: Secondary | ICD-10-CM | POA: Diagnosis not present

## 2021-09-24 ENCOUNTER — Other Ambulatory Visit: Payer: Self-pay | Admitting: Family Medicine

## 2021-09-24 DIAGNOSIS — M810 Age-related osteoporosis without current pathological fracture: Secondary | ICD-10-CM

## 2021-09-24 NOTE — Telephone Encounter (Signed)
Requested medication (s) are due for refill today: yes  Requested medication (s) are on the active medication list: yes  Last refill:  04/24/21  Future visit scheduled: yes  Notes to clinic:  Unable to refill per protocol due to failed labs, no updated results.      Requested Prescriptions  Pending Prescriptions Disp Refills   alendronate (FOSAMAX) 70 MG tablet [Pharmacy Med Name: ALENDRONATE SODIUM 70 MG TAB] 4 tablet 1    Sig: TAKE 1 TABLET EVERY 7 DAYS WITH A FULL GLASS OF WATER ON AN EMPTY STOMACH DO NOT LIE DOWN FOR AT LEAST 30 MIN     Endocrinology:  Bisphosphonates Failed - 09/24/2021 12:19 PM      Failed - Ca in normal range and within 360 days    Calcium  Date Value Ref Range Status  05/28/2020 10.2 8.7 - 10.3 mg/dL Final          Failed - Vitamin D in normal range and within 360 days    No results found for: [id number], [id number], [id number], 25OHVITD3, 25OHVITD2, 25OHVITD3, 25OHVITD2, 25OHVITD1, 25OHVITD2, 25OHVITD3, VD25OH        Passed - Valid encounter within last 12 months    Recent Outpatient Visits           2 months ago Essential hypertension   [facility] Jerrol Banana., MD   7 months ago Primary hypertension   [facility] Jerrol Banana., MD   10 months ago Essential hypertension   [facility] Jerrol Banana., MD   10 months ago Herpes zoster without complication   [facility] Jerrol Banana., MD   1 year ago Essential hypertension   [facility] Jerrol Banana., MD       Future Appointments             In 3 months Jerrol Banana., MD [facility]

## 2021-11-03 ENCOUNTER — Other Ambulatory Visit: Payer: Self-pay | Admitting: Family Medicine

## 2021-11-03 DIAGNOSIS — I10 Essential (primary) hypertension: Secondary | ICD-10-CM

## 2021-11-03 NOTE — Telephone Encounter (Signed)
Requested Prescriptions  Pending Prescriptions Disp Refills   amLODipine (NORVASC) 5 MG tablet [Pharmacy Med Name: AMLODIPINE BESYLATE 5 MG TAB] 90 tablet 0    Sig: TAKE ONE TABLET BY MOUTH EVERY DAY     Cardiovascular: Calcium Channel Blockers 2 Failed - 11/03/2021 12:55 PM      Failed - Last BP in normal range    BP Readings from Last 1 Encounters:  06/29/21 (!) 155/76         Passed - Last Heart Rate in normal range    Pulse Readings from Last 1 Encounters:  06/29/21 67         Passed - Valid encounter within last 6 months    Recent Outpatient Visits          4 months ago Essential hypertension   [facility] Jerrol Banana., MD   8 months ago Primary hypertension   [facility] Jerrol Banana., MD   11 months ago Essential hypertension   [facility] Jerrol Banana., MD   12 months ago Herpes zoster without complication   [facility] Jerrol Banana., MD   1 year ago Essential hypertension   [facility] Jerrol Banana., MD      Future Appointments            In 1 month Jerrol Banana., MD [facility] [place]

## 2021-12-14 DIAGNOSIS — Z961 Presence of intraocular lens: Secondary | ICD-10-CM | POA: Diagnosis not present

## 2021-12-25 DIAGNOSIS — M7541 Impingement syndrome of right shoulder: Secondary | ICD-10-CM | POA: Diagnosis not present

## 2021-12-25 NOTE — Progress Notes (Signed)
?  ? ? ?Established patient visit ? ?I,[name],acting as a scribe for [name], MD.,have documented all relevant documentation on the behalf of [name], MD,as directed by  [name], MD while in the presence of [name], MD. ? ? ?Patient: Felicia Weaver   DOB: 01/21/1925   88 y.o. Female  MRN: [id number] ?Visit Date: 12/28/2021 ? ?Today's healthcare provider: [name], MD  ? ?Chief Complaint  ?Patient presents with  ? Follow-up  ? Hypertension  ? ?Subjective  ?  ?HPI  ?Patient complains of muffled hearing in her right ear.  Overall she is doing fairly well. ?She continues to live independently. ?She has had no falls.  Cognition is stable.  She does not attempt to drive. ?Hypertension, follow-up ? ?BP Readings from Last 3 Encounters:  ?12/28/21 (!) 146/62  ?06/29/21 (!) 155/76  ?02/23/21 140/66  ? Wt Readings from Last 3 Encounters:  ?12/28/21 124 lb (56.2 kg)  ?06/29/21 127 lb (57.6 kg)  ?02/23/21 129 lb (58.5 kg)  ?  ? ?She was last seen for hypertension 7 months ago.  ?BP at that visit was 155/76.  ?Management since that visit includes; Get [employer] or [employer] blood pressure cuff for home use.  Bring in numbers on next visit. ? ?She reports good compliance with treatment. ?She is not having side effects. none ?She is following a Regular, Low Sodium diet. ?She is not exercising. ?She does not smoke. ? ?Use of agents associated with hypertension: none.  ? ?Outside blood pressures are not checking159/68. ? ?Pertinent labs ?Lab Results  ?Component Value Date  ? CHOL 225 (H) 05/16/2019  ? HDL 68 05/16/2019  ? LDLCALC 142 (H) 05/16/2019  ? TRIG 88 05/16/2019  ? CHOLHDL 3.3 05/16/2019  ? Lab Results  ?Component Value Date  ? NA 142 05/28/2020  ? K 4.8 05/28/2020  ? CREATININE 1.44 (H) 05/28/2020  ? GFRNONAA 31 (L) 05/28/2020  ? GLUCOSE 99 05/28/2020  ? TSH 3.280 05/16/2019  ?  ? ?The ASCVD Risk score (Arnett DK, et al., 2019) failed to calculate for the following  reasons: ?  The 2019 ASCVD risk score is only valid for ages 45 to 41 ? ?--------------------------------------------------------------------------------------------------- ? ? ?Medications: ?Outpatient Medications Prior to Visit  ?Medication Sig  ? alendronate (FOSAMAX) 70 MG tablet TAKE 1 TABLET EVERY 7 DAYS WITH A FULL GLASS OF WATER ON AN EMPTY STOMACH DO NOT LIE DOWN FOR AT LEAST 30 MIN  ? amLODipine (NORVASC) 5 MG tablet TAKE ONE TABLET BY MOUTH EVERY DAY  ? aspirin 81 MG tablet Take 81 mg by mouth daily.   ? B Complex Vitamins (B COMPLEX-B12 PO) Take by mouth.  ? Cholecalciferol (VITAMIN D3) 10 MCG (400 UNIT) tablet Take by mouth daily. Dose unknown  ? MAGNESIUM PO Take by mouth daily. Unsure dose  ? Melatonin-Pyridoxine (MELATIN PO) Take by mouth at bedtime as needed. Unsure dose  ? mometasone (ELOCON) 0.1 % cream Apply 1 application topically daily.  ? Multiple Vitamins tablet Take 1 tablet by mouth daily.   ? triamcinolone cream (KENALOG) 0.1 %   ? ?No facility-administered medications prior to visit.  ? ? ?Review of Systems ? ?Last hemoglobin A1c ?Lab Results  ?Component Value Date  ? HGBA1C 6.0 (A) 03/27/2020  ? ?  ?  Objective  ?  ?BP (!) 146/62 (BP Location: Right Arm, Cuff Size: Normal)   Pulse 64   Temp 98.3 ?F (36.8 ?C) (Temporal)  Resp 16   Ht '5\' 3"'$  (1.6 m)   Wt 124 lb (56.2 kg)   SpO2 97%   BMI 21.97 kg/m?  ?BP Readings from Last 3 Encounters:  ?12/28/21 (!) 146/62  ?06/29/21 (!) 155/76  ?02/23/21 140/66  ? ?Wt Readings from Last 3 Encounters:  ?12/28/21 124 lb (56.2 kg)  ?06/29/21 127 lb (57.6 kg)  ?02/23/21 129 lb (58.5 kg)  ? ?  ? ?Physical Exam ?Vitals reviewed.  ?Constitutional:   ?   Appearance: She is well-developed.  ?HENT:  ?   Head: Normocephalic and atraumatic.  ?Eyes:  ?   Conjunctiva/sclera: Conjunctivae normal.  ?   Pupils: Pupils are equal, round, and reactive to light.  ?Cardiovascular:  ?   Rate and Rhythm: Normal rate and regular rhythm.  ?   Heart sounds: Normal heart  sounds.  ?Pulmonary:  ?   Effort: Pulmonary effort is normal.  ?   Breath sounds: Normal breath sounds.  ?Abdominal:  ?   Palpations: Abdomen is soft.  ?Musculoskeletal:  ?   Cervical back: Normal range of motion and neck supple.  ?Skin: ?   General: Skin is warm and dry.  ?   Comments: Very fair skin.  ?Neurological:  ?   General: No focal deficit present.  ?   Mental Status: She is alert and oriented to person, place, and time.  ?   Deep Tendon Reflexes: Reflexes are normal and symmetric.  ?Psychiatric:     ?   Mood and Affect: Mood normal.     ?   Behavior: Behavior normal.     ?   Thought Content: Thought content normal.     ?   Judgment: Judgment normal.  ?  ? ? ?No results found for any visits on 12/28/21. ? Assessment & Plan  ?  ? ?1. Essential hypertension ?Fairly good blood pressure in this 88 year old.  Check home blood pressure readings. ?- Lipid panel ?- TSH ?- CBC w/Diff/Platelet ?- Comprehensive Metabolic Panel (CMET) ?- Parathyroid hormone, intact (no Ca) ? ?2. Adult hypothyroidism ?Treat for euthyroid TSH ?- Lipid panel ?- TSH ?- CBC w/Diff/Platelet ?- Comprehensive Metabolic Panel (CMET) ?- Parathyroid hormone, intact (no Ca) ? ?3. MCI (mild cognitive impairment) ?Clinically stable.  MMSE on next visit.  MMSE today is 25/30 ?- Lipid panel ?- TSH ?- CBC w/Diff/Platelet ?- Comprehensive Metabolic Panel (CMET) ?- Parathyroid hormone, intact (no Ca) ? ?4. Hyperglycemia ? ?- Lipid panel ?- TSH ?- CBC w/Diff/Platelet ?- Comprehensive Metabolic Panel (CMET) ?- Parathyroid hormone, intact (no Ca) ? ?5. Weight loss ?Weight slowly decreasing but patient nor daughter want work-up.  She feels well. ?Work: Eating more getting more calories ?- Lipid panel ?- TSH ?- CBC w/Diff/Platelet ?- Comprehensive Metabolic Panel (CMET) ?- Parathyroid hormone, intact (no Ca) ? ?6. Ceruminosis, right ?Irrigated ? ? ?No follow-ups on file.  ?   ? ?I, [name], MD, have reviewed all documentation for this visit. The  documentation on 01/02/22 for the exam, diagnosis, procedures, and orders are all accurate and complete. ? ? ? ?Elaya Droege Cranford Mon, MD  ?[facility] ?[phone] (phone) ?[phone] (fax) ? ?[facility] ?

## 2021-12-28 ENCOUNTER — Ambulatory Visit (INDEPENDENT_AMBULATORY_CARE_PROVIDER_SITE_OTHER): Payer: PPO | Admitting: Family Medicine

## 2021-12-28 ENCOUNTER — Encounter: Payer: Self-pay | Admitting: Family Medicine

## 2021-12-28 ENCOUNTER — Telehealth: Payer: Self-pay | Admitting: Family Medicine

## 2021-12-28 VITALS — BP 146/62 | HR 64 | Temp 98.3°F | Resp 16 | Ht 63.0 in | Wt 124.0 lb

## 2021-12-28 DIAGNOSIS — G3184 Mild cognitive impairment, so stated: Secondary | ICD-10-CM

## 2021-12-28 DIAGNOSIS — R634 Abnormal weight loss: Secondary | ICD-10-CM | POA: Diagnosis not present

## 2021-12-28 DIAGNOSIS — I10 Essential (primary) hypertension: Secondary | ICD-10-CM

## 2021-12-28 DIAGNOSIS — R739 Hyperglycemia, unspecified: Secondary | ICD-10-CM | POA: Diagnosis not present

## 2021-12-28 DIAGNOSIS — E039 Hypothyroidism, unspecified: Secondary | ICD-10-CM | POA: Diagnosis not present

## 2021-12-28 DIAGNOSIS — H6121 Impacted cerumen, right ear: Secondary | ICD-10-CM

## 2021-12-28 NOTE — Telephone Encounter (Signed)
FYI-put note on patient's appt. ?

## 2021-12-28 NOTE — Telephone Encounter (Signed)
Daughter wanted to ask if her mom's cognitive skills could be tested during today's visit. She didn't want to ask Dr Rosanna Randy in front of her mom. ?

## 2021-12-28 NOTE — Telephone Encounter (Signed)
Noted  

## 2021-12-29 LAB — LIPID PANEL
Chol/HDL Ratio: 2.9 ratio (ref 0.0–4.4)
Cholesterol, Total: 225 mg/dL — ABNORMAL HIGH (ref 100–199)
HDL: 77 mg/dL (ref >39)
LDL Chol Calc (NIH): 122 mg/dL — ABNORMAL HIGH (ref 0–99)
Triglycerides: 152 mg/dL — ABNORMAL HIGH (ref 0–149)
VLDL Cholesterol Cal: 26 mg/dL (ref 5–40)

## 2021-12-29 LAB — CBC WITH DIFFERENTIAL/PLATELET
Basophils Absolute: 0 10*3/uL (ref 0.0–0.2)
Basos: 0 %
EOS (ABSOLUTE): 0 10*3/uL (ref 0.0–0.4)
Eos: 0 %
Hematocrit: 38.2 % (ref 34.0–46.6)
Hemoglobin: 12.7 g/dL (ref 11.1–15.9)
Immature Grans (Abs): 0 10*3/uL (ref 0.0–0.1)
Immature Granulocytes: 0 %
Lymphocytes Absolute: 1.2 10*3/uL (ref 0.7–3.1)
Lymphs: 15 %
MCH: 30.6 pg (ref 26.6–33.0)
MCHC: 33.2 g/dL (ref 31.5–35.7)
MCV: 92 fL (ref 79–97)
Monocytes Absolute: 0.7 10*3/uL (ref 0.1–0.9)
Monocytes: 8 %
Neutrophils Absolute: 6.4 10*3/uL (ref 1.4–7.0)
Neutrophils: 77 %
Platelets: 186 10*3/uL (ref 150–450)
RBC: 4.15 x10E6/uL (ref 3.77–5.28)
RDW: 11.9 % (ref 11.7–15.4)
WBC: 8.3 10*3/uL (ref 3.4–10.8)

## 2021-12-29 LAB — COMPREHENSIVE METABOLIC PANEL
ALT: 14 IU/L (ref 0–32)
AST: 20 IU/L (ref 0–40)
Albumin/Globulin Ratio: 1.7 (ref 1.2–2.2)
Albumin: 4.4 g/dL (ref 3.5–4.6)
Alkaline Phosphatase: 52 IU/L (ref 44–121)
BUN/Creatinine Ratio: 23 (ref 12–28)
BUN: 30 mg/dL (ref 10–36)
Bilirubin Total: 0.3 mg/dL (ref 0.0–1.2)
CO2: 27 mmol/L (ref 20–29)
Calcium: 10.5 mg/dL — ABNORMAL HIGH (ref 8.7–10.3)
Chloride: 100 mmol/L (ref 96–106)
Creatinine, Ser: 1.33 mg/dL — ABNORMAL HIGH (ref 0.57–1.00)
Globulin, Total: 2.6 g/dL (ref 1.5–4.5)
Glucose: 120 mg/dL — ABNORMAL HIGH (ref 70–99)
Potassium: 4.5 mmol/L (ref 3.5–5.2)
Sodium: 139 mmol/L (ref 134–144)
Total Protein: 7 g/dL (ref 6.0–8.5)
eGFR: 37 mL/min/{1.73_m2} — ABNORMAL LOW (ref >59)

## 2021-12-29 LAB — TSH: TSH: 1.84 u[IU]/mL (ref 0.450–4.500)

## 2021-12-29 LAB — PARATHYROID HORMONE, INTACT (NO CA): PTH: 67 pg/mL — ABNORMAL HIGH (ref 15–65)

## 2022-01-07 ENCOUNTER — Encounter: Payer: Self-pay | Admitting: Family Medicine

## 2022-02-03 ENCOUNTER — Other Ambulatory Visit: Payer: Self-pay | Admitting: Family Medicine

## 2022-02-03 DIAGNOSIS — I10 Essential (primary) hypertension: Secondary | ICD-10-CM

## 2022-02-04 NOTE — Telephone Encounter (Signed)
Requested Prescriptions  Pending Prescriptions Disp Refills  . amLODipine (NORVASC) 5 MG tablet [Pharmacy Med Name: AMLODIPINE BESYLATE 5 MG TAB] 90 tablet 0    Sig: TAKE ONE TABLET BY MOUTH EVERY DAY     Cardiovascular: Calcium Channel Blockers 2 Failed - 02/03/2022  3:43 PM      Failed - Last BP in normal range    BP Readings from Last 1 Encounters:  12/28/21 (!) 146/62         Passed - Last Heart Rate in normal range    Pulse Readings from Last 1 Encounters:  12/28/21 64         Passed - Valid encounter within last 6 months    Recent Outpatient Visits          1 month ago Essential hypertension   [facility] Jerrol Banana., MD   7 months ago Essential hypertension   [facility] Jerrol Banana., MD   11 months ago Primary hypertension   [facility] Jerrol Banana., MD   1 year ago Essential hypertension   [facility] Jerrol Banana., MD   1 year ago Herpes zoster without complication   [facility] Jerrol Banana., MD

## 2022-03-18 ENCOUNTER — Other Ambulatory Visit: Payer: Self-pay | Admitting: Physician Assistant

## 2022-03-18 DIAGNOSIS — M810 Age-related osteoporosis without current pathological fracture: Secondary | ICD-10-CM

## 2022-03-19 NOTE — Telephone Encounter (Signed)
Requested Prescriptions  Pending Prescriptions Disp Refills  . alendronate (FOSAMAX) 70 MG tablet [Pharmacy Med Name: ALENDRONATE SODIUM 70 MG TAB] 12 tablet 0    Sig: TAKE 1 TABLET EVERY 7 DAYS WITH A FULL GLASS OF WATER ON AN EMPTY STOMACH DO NOT LIE DOWN FOR AT LEAST 30 MIN     Endocrinology:  Bisphosphonates Failed - 03/18/2022  2:07 PM      Failed - Ca in normal range and within 360 days    Calcium  Date Value Ref Range Status  12/28/2021 10.5 (H) 8.7 - 10.3 mg/dL Final         Failed - Vitamin D in normal range and within 360 days    No results found for: "TM2111NB5", "AP0141CV0", "VD125OH2TOT", "25OHVITD3", "25OHVITD2", "25OHVITD1", "VD25OH"       Failed - Cr in normal range and within 360 days    Creat  Date Value Ref Range Status  07/25/2017 1.99 (H) 0.60 - 0.88 mg/dL Final    Comment:    For patients >68 years of age, the reference limit for Creatinine is approximately 13% higher for people identified as African-American. .    Creatinine, Ser  Date Value Ref Range Status  12/28/2021 1.33 (H) 0.57 - 1.00 mg/dL Final         Failed - Mg Level in normal range and within 360 days    No results found for: "MG"       Failed - Phosphate in normal range and within 360 days    Phosphorus  Date Value Ref Range Status  05/28/2020 3.5 3.0 - 4.3 mg/dL Final         Passed - eGFR is 30 or above and within 360 days    GFR, Est African American  Date Value Ref Range Status  07/25/2017 25 (L) > OR = 60 mL/min/1.30m2 Final   GFR calc Af Amer  Date Value Ref Range Status  05/28/2020 36 (L) >59 mL/min/1.73 Final    Comment:    **[facility] currently reports eGFR in compliance with the current**   recommendations of the [employer]. [facility] will   update reporting as new guidelines are published from the NKF-ASN   Task force.    GFR, Est Non African American  Date Value Ref Range Status  07/25/2017 22 (L) > OR = 60 mL/min/1.16m2 Final   GFR calc non Af Amer   Date Value Ref Range Status  05/28/2020 31 (L) >59 mL/min/1.73 Final   eGFR  Date Value Ref Range Status  12/28/2021 37 (L) >59 mL/min/1.73 Final         Passed - Valid encounter within last 12 months    Recent Outpatient Visits          2 months ago Essential hypertension   [facility] Jerrol Banana., MD   8 months ago Essential hypertension   [facility] Jerrol Banana., MD   1 year ago Primary hypertension   [facility] Jerrol Banana., MD   1 year ago Essential hypertension   [facility] Jerrol Banana., MD   1 year ago Herpes zoster without complication   [facility] Jerrol Banana., MD             Passed - Bone Mineral Density or Dexa Scan completed in the last 2 years

## 2022-04-28 ENCOUNTER — Other Ambulatory Visit: Payer: Self-pay | Admitting: Family Medicine

## 2022-04-28 DIAGNOSIS — I10 Essential (primary) hypertension: Secondary | ICD-10-CM

## 2022-04-28 NOTE — Telephone Encounter (Signed)
Requested Prescriptions  Pending Prescriptions Disp Refills  . amLODipine (NORVASC) 5 MG tablet [Pharmacy Med Name: AMLODIPINE BESYLATE 5 MG TAB] 90 tablet 0    Sig: TAKE ONE TABLET BY MOUTH EVERY DAY     Cardiovascular: Calcium Channel Blockers 2 Failed - 04/28/2022 11:00 AM      Failed - Last BP in normal range    BP Readings from Last 1 Encounters:  12/28/21 (!) 146/62         Passed - Last Heart Rate in normal range    Pulse Readings from Last 1 Encounters:  12/28/21 64         Passed - Valid encounter within last 6 months    Recent Outpatient Visits          4 months ago Essential hypertension   [facility] Jerrol Banana., MD   10 months ago Essential hypertension   [facility] Jerrol Banana., MD   1 year ago Primary hypertension   [facility] Jerrol Banana., MD   1 year ago Essential hypertension   [facility] Jerrol Banana., MD   1 year ago Herpes zoster without complication   [facility] Jerrol Banana., MD

## 2022-06-14 DIAGNOSIS — Z961 Presence of intraocular lens: Secondary | ICD-10-CM | POA: Diagnosis not present

## 2022-07-30 ENCOUNTER — Telehealth: Payer: Self-pay | Admitting: Family Medicine

## 2022-07-30 NOTE — Telephone Encounter (Signed)
[facility] faxed refill request for the following medications:   amLODipine (NORVASC) 5 MG tablet     Please advise.

## 2022-08-02 ENCOUNTER — Other Ambulatory Visit: Payer: Self-pay

## 2022-08-02 DIAGNOSIS — I10 Essential (primary) hypertension: Secondary | ICD-10-CM

## 2022-08-02 MED ORDER — AMLODIPINE BESYLATE 5 MG PO TABS: 5.0000 mg | ORAL_TABLET | Freq: Every day | ORAL | 0 refills | Status: DC

## 2022-09-27 DIAGNOSIS — N390 Urinary tract infection, site not specified: Secondary | ICD-10-CM | POA: Diagnosis not present

## 2022-09-27 DIAGNOSIS — R4182 Altered mental status, unspecified: Secondary | ICD-10-CM | POA: Diagnosis not present

## 2022-09-27 DIAGNOSIS — A499 Bacterial infection, unspecified: Secondary | ICD-10-CM | POA: Diagnosis not present

## 2022-09-27 DIAGNOSIS — R34 Anuria and oliguria: Secondary | ICD-10-CM | POA: Diagnosis not present

## 2022-09-30 DIAGNOSIS — F320 Major depressive disorder, single episode, mild: Secondary | ICD-10-CM | POA: Diagnosis not present

## 2022-09-30 DIAGNOSIS — R7989 Other specified abnormal findings of blood chemistry: Secondary | ICD-10-CM | POA: Diagnosis not present

## 2022-09-30 DIAGNOSIS — G3184 Mild cognitive impairment, so stated: Secondary | ICD-10-CM | POA: Diagnosis not present

## 2022-09-30 DIAGNOSIS — N1832 Chronic kidney disease, stage 3b: Secondary | ICD-10-CM | POA: Diagnosis not present

## 2022-11-12 ENCOUNTER — Other Ambulatory Visit: Payer: Self-pay | Admitting: Family Medicine

## 2022-11-12 ENCOUNTER — Ambulatory Visit
Admission: RE | Admit: 2022-11-12 | Discharge: 2022-11-12 | Disposition: A | Discharge status: Home / Self Care | Payer: PPO | Source: Ambulatory Visit | Attending: Family Medicine | Admitting: Family Medicine

## 2022-11-12 DIAGNOSIS — F3341 Major depressive disorder, recurrent, in partial remission: Secondary | ICD-10-CM | POA: Diagnosis not present

## 2022-11-12 DIAGNOSIS — M79662 Pain in left lower leg: Secondary | ICD-10-CM

## 2022-11-12 DIAGNOSIS — R600 Localized edema: Secondary | ICD-10-CM | POA: Diagnosis not present

## 2022-11-12 DIAGNOSIS — I10 Essential (primary) hypertension: Secondary | ICD-10-CM | POA: Diagnosis not present

## 2022-11-17 DIAGNOSIS — M7122 Synovial cyst of popliteal space [Baker], left knee: Secondary | ICD-10-CM | POA: Diagnosis not present

## 2022-11-17 DIAGNOSIS — M1712 Unilateral primary osteoarthritis, left knee: Secondary | ICD-10-CM | POA: Diagnosis not present

## 2022-11-25 DIAGNOSIS — M79605 Pain in left leg: Secondary | ICD-10-CM | POA: Diagnosis not present

## 2022-11-25 DIAGNOSIS — M25562 Pain in left knee: Secondary | ICD-10-CM | POA: Diagnosis not present

## 2022-11-25 DIAGNOSIS — M7122 Synovial cyst of popliteal space [Baker], left knee: Secondary | ICD-10-CM | POA: Diagnosis not present

## 2022-11-25 DIAGNOSIS — S8012XS Contusion of left lower leg, sequela: Secondary | ICD-10-CM | POA: Diagnosis not present

## 2022-12-13 DIAGNOSIS — H179 Unspecified corneal scar and opacity: Secondary | ICD-10-CM | POA: Diagnosis not present

## 2022-12-13 DIAGNOSIS — Z961 Presence of intraocular lens: Secondary | ICD-10-CM | POA: Diagnosis not present

## 2022-12-15 DIAGNOSIS — N1832 Chronic kidney disease, stage 3b: Secondary | ICD-10-CM | POA: Diagnosis not present

## 2022-12-15 DIAGNOSIS — M7122 Synovial cyst of popliteal space [Baker], left knee: Secondary | ICD-10-CM | POA: Diagnosis not present

## 2022-12-15 DIAGNOSIS — E039 Hypothyroidism, unspecified: Secondary | ICD-10-CM | POA: Diagnosis not present

## 2022-12-15 DIAGNOSIS — F3341 Major depressive disorder, recurrent, in partial remission: Secondary | ICD-10-CM | POA: Diagnosis not present

## 2022-12-15 DIAGNOSIS — H409 Unspecified glaucoma: Secondary | ICD-10-CM | POA: Diagnosis not present

## 2022-12-15 DIAGNOSIS — G3184 Mild cognitive impairment, so stated: Secondary | ICD-10-CM | POA: Diagnosis not present

## 2022-12-15 DIAGNOSIS — I10 Essential (primary) hypertension: Secondary | ICD-10-CM | POA: Diagnosis not present

## 2022-12-15 DIAGNOSIS — M79662 Pain in left lower leg: Secondary | ICD-10-CM | POA: Diagnosis not present

## 2023-05-04 DIAGNOSIS — F3341 Major depressive disorder, recurrent, in partial remission: Secondary | ICD-10-CM | POA: Diagnosis not present

## 2023-05-04 DIAGNOSIS — N1832 Chronic kidney disease, stage 3b: Secondary | ICD-10-CM | POA: Diagnosis not present

## 2023-05-04 DIAGNOSIS — E039 Hypothyroidism, unspecified: Secondary | ICD-10-CM | POA: Diagnosis not present

## 2023-05-04 DIAGNOSIS — Z0000 Encounter for general adult medical examination without abnormal findings: Secondary | ICD-10-CM | POA: Diagnosis not present

## 2023-05-04 DIAGNOSIS — G3184 Mild cognitive impairment, so stated: Secondary | ICD-10-CM | POA: Diagnosis not present

## 2023-05-04 DIAGNOSIS — H409 Unspecified glaucoma: Secondary | ICD-10-CM | POA: Diagnosis not present

## 2023-05-04 DIAGNOSIS — R7309 Other abnormal glucose: Secondary | ICD-10-CM | POA: Diagnosis not present

## 2023-05-04 DIAGNOSIS — I10 Essential (primary) hypertension: Secondary | ICD-10-CM | POA: Diagnosis not present

## 2023-06-20 DIAGNOSIS — Z961 Presence of intraocular lens: Secondary | ICD-10-CM | POA: Diagnosis not present

## 2023-06-20 DIAGNOSIS — H16239 Neurotrophic keratoconjunctivitis, unspecified eye: Secondary | ICD-10-CM | POA: Diagnosis not present

## 2023-06-20 DIAGNOSIS — H18422 Band keratopathy, left eye: Secondary | ICD-10-CM | POA: Diagnosis not present

## 2023-06-20 DIAGNOSIS — Z0100 Encounter for examination of eyes and vision without abnormal findings: Secondary | ICD-10-CM | POA: Diagnosis not present

## 2023-06-22 ENCOUNTER — Ambulatory Visit: Payer: PPO | Admitting: Dermatology

## 2023-06-22 VITALS — BP 124/70 | HR 80

## 2023-06-22 DIAGNOSIS — D492 Neoplasm of unspecified behavior of bone, soft tissue, and skin: Secondary | ICD-10-CM

## 2023-06-22 DIAGNOSIS — C44622 Squamous cell carcinoma of skin of right upper limb, including shoulder: Secondary | ICD-10-CM | POA: Diagnosis not present

## 2023-06-22 DIAGNOSIS — L719 Rosacea, unspecified: Secondary | ICD-10-CM

## 2023-06-22 DIAGNOSIS — L723 Sebaceous cyst: Secondary | ICD-10-CM

## 2023-06-22 DIAGNOSIS — Z85828 Personal history of other malignant neoplasm of skin: Secondary | ICD-10-CM | POA: Diagnosis not present

## 2023-06-22 DIAGNOSIS — L821 Other seborrheic keratosis: Secondary | ICD-10-CM

## 2023-06-22 DIAGNOSIS — D692 Other nonthrombocytopenic purpura: Secondary | ICD-10-CM | POA: Diagnosis not present

## 2023-06-22 MED ORDER — METRONIDAZOLE 0.75 % EX CREA: TOPICAL_CREAM | CUTANEOUS | 2 refills | Status: AC

## 2023-06-22 NOTE — Progress Notes (Signed)
Follow-Up Visit   Subjective  Felicia Weaver is a 89 y.o. female who presents for the following: Patient c/o spots on her face and nose that will not go away   The patient has spots, moles and lesions to be evaluated, some may be new or changing and the patient may have concern these could be cancer.  Daughter is with patient and contributes to history.    The following portions of the chart were reviewed this encounter and updated as appropriate: medications, allergies, medical history  Review of Systems:  No other skin or systemic complaints except as noted in HPI or Assessment and Plan.  Objective  Well appearing patient in no apparent distress; mood and affect are within normal limits.  A focused examination was performed of the following areas:face,arms   Relevant physical exam findings are noted in the Assessment and Plan.  Right Forearm 1.0 cm pink firm nodule with keratotic plug            Assessment & Plan   Neoplasm of skin Right Forearm  Epidermal / dermal shaving  Lesion diameter (cm):  1 Informed consent: discussed and consent obtained   Timeout: patient name, date of birth, surgical site, and procedure verified   Procedure prep:  Patient was prepped and draped in usual sterile fashion Prep type:  Isopropyl alcohol Anesthesia: the lesion was anesthetized in a standard fashion   Anesthetic:  1% lidocaine w/ epinephrine 1-100,000 buffered w/ 8.4% NaHCO3 Hemostasis achieved with: pressure, aluminum chloride and electrodesiccation   Outcome: patient tolerated procedure well    Destruction of lesion  Destruction method: electrodesiccation and curettage   Informed consent: discussed and consent obtained   Curettage performed in three different directions: Yes   Electrodesiccation performed over the curetted area: Yes   Final wound size (cm):  1.5 Hemostasis achieved with:  pressure, aluminum chloride and electrodesiccation Outcome: patient  tolerated procedure well with no complications   Post-procedure details: wound care instructions given   Additional details:  Mupirocin ointment and Bandaid applied   Specimen 1 - Surgical pathology Differential Diagnosis: R/O SCC  Check Margins: No    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  - Call if any new or changing lesions are noted between office visits    ROSACEA Exam Erythema with multiple crusted inflammatory papules on the L malar cheek/upper lip and nose  Chronic and persistent condition with duration or expected duration over one year. Condition is bothersome/symptomatic for patient. Currently flared.   Rosacea is a chronic progressive skin condition usually affecting the face of adults, causing redness and/or acne bumps. It is treatable but not curable. It sometimes affects the eyes (ocular rosacea) as well. It may respond to topical and/or systemic medication and can flare with stress, sun exposure, alcohol, exercise, topical steroids (including hydrocortisone/cortisone 10) and some foods.  Daily application of broad spectrum spf 30+ sunscreen to face is recommended to reduce flares.   Treatment Plan Restart Metronidazole 0.75% cream apply to face qd-bid prn Patient's daughter declines oral Doxycycline treatment  SEBORRHEIC KERATOSIS - Stuck-on, waxy, tan-brown papules and/or plaques  - Benign-appearing - Discussed benign etiology and prognosis. - Observe - Call for any changes  Purpura - Chronic; persistent and recurrent.  Treatable, but not curable. - Violaceous  macules and patches - Benign - Related to trauma, age, sun damage and/or use of blood thinners, chronic use of topical and/or oral steroids - Observe - Can use OTC arnica containing moisturizer  such as Dermend Bruise Formula if desired - Call for worsening or other concerns    Cyst Inflamed  Right mid cheek 0.6 cm pink firm subcutaneous nodule, See photo  Treatment Plan:  Start metrocream twice daily to aa Benign-appearing. Observe for changes, RTC if growing, symptomatic    Return in about 6 months (around 12/21/2023) for hx of SCC.  I, Angelique Holm, CMA, am acting as scribe for Willeen Niece, MD .   Documentation: I have reviewed the above documentation for accuracy and completeness, and I agree with the above.  Willeen Niece, MD

## 2023-06-22 NOTE — Patient Instructions (Addendum)

## 2023-06-24 LAB — SURGICAL PATHOLOGY

## 2023-06-27 ENCOUNTER — Telehealth: Payer: Self-pay

## 2023-06-27 NOTE — Telephone Encounter (Signed)
Advised patient's daughter, Alona Bene, biopsy of the right forearm was SCC and has already been treated with EDC. Will recheck at follow-up appointment.

## 2023-06-27 NOTE — Telephone Encounter (Signed)
-----   Message from Willeen Niece sent at 06/24/2023  6:52 PM EDT ----- 1. Skin, right forearm :       WELL DIFFERENTIATED SQUAMOUS CELL CARCINOMA   SCC skin cancer- already treated with EDC at time of biopsy    - please call patient

## 2023-07-17 ENCOUNTER — Encounter: Payer: Self-pay | Admitting: Dermatology

## 2023-07-29 DIAGNOSIS — E8770 Fluid overload, unspecified: Secondary | ICD-10-CM | POA: Diagnosis not present

## 2023-07-29 DIAGNOSIS — R600 Localized edema: Secondary | ICD-10-CM | POA: Diagnosis not present

## 2023-07-30 DIAGNOSIS — Z23 Encounter for immunization: Secondary | ICD-10-CM | POA: Diagnosis not present

## 2023-07-30 DIAGNOSIS — R7303 Prediabetes: Secondary | ICD-10-CM | POA: Diagnosis not present

## 2023-07-30 DIAGNOSIS — Z882 Allergy status to sulfonamides status: Secondary | ICD-10-CM | POA: Diagnosis not present

## 2023-07-30 DIAGNOSIS — N183 Chronic kidney disease, stage 3 unspecified: Secondary | ICD-10-CM | POA: Diagnosis present

## 2023-07-30 DIAGNOSIS — I10 Essential (primary) hypertension: Secondary | ICD-10-CM | POA: Diagnosis not present

## 2023-07-30 DIAGNOSIS — G3184 Mild cognitive impairment, so stated: Secondary | ICD-10-CM | POA: Diagnosis not present

## 2023-07-30 DIAGNOSIS — I509 Heart failure, unspecified: Secondary | ICD-10-CM | POA: Diagnosis not present

## 2023-07-30 DIAGNOSIS — I130 Hypertensive heart and chronic kidney disease with heart failure and stage 1 through stage 4 chronic kidney disease, or unspecified chronic kidney disease: Secondary | ICD-10-CM | POA: Diagnosis not present

## 2023-07-30 DIAGNOSIS — I5032 Chronic diastolic (congestive) heart failure: Secondary | ICD-10-CM | POA: Diagnosis not present

## 2023-07-30 DIAGNOSIS — I872 Venous insufficiency (chronic) (peripheral): Secondary | ICD-10-CM | POA: Diagnosis not present

## 2023-07-30 DIAGNOSIS — H409 Unspecified glaucoma: Secondary | ICD-10-CM | POA: Diagnosis not present

## 2023-07-30 DIAGNOSIS — E8770 Fluid overload, unspecified: Secondary | ICD-10-CM | POA: Diagnosis not present

## 2023-07-30 DIAGNOSIS — R600 Localized edema: Secondary | ICD-10-CM | POA: Diagnosis not present

## 2023-07-30 DIAGNOSIS — R918 Other nonspecific abnormal finding of lung field: Secondary | ICD-10-CM | POA: Diagnosis not present

## 2023-07-30 DIAGNOSIS — R54 Age-related physical debility: Secondary | ICD-10-CM | POA: Diagnosis not present

## 2023-07-31 DIAGNOSIS — I444 Left anterior fascicular block: Secondary | ICD-10-CM | POA: Diagnosis not present

## 2023-07-31 DIAGNOSIS — I4510 Unspecified right bundle-branch block: Secondary | ICD-10-CM | POA: Diagnosis not present

## 2023-08-02 DIAGNOSIS — I10 Essential (primary) hypertension: Secondary | ICD-10-CM | POA: Diagnosis not present

## 2023-08-02 DIAGNOSIS — H409 Unspecified glaucoma: Secondary | ICD-10-CM | POA: Diagnosis not present

## 2023-08-02 DIAGNOSIS — E039 Hypothyroidism, unspecified: Secondary | ICD-10-CM | POA: Diagnosis not present

## 2023-08-02 DIAGNOSIS — N183 Chronic kidney disease, stage 3 unspecified: Secondary | ICD-10-CM | POA: Diagnosis not present

## 2023-08-02 DIAGNOSIS — I110 Hypertensive heart disease with heart failure: Secondary | ICD-10-CM | POA: Diagnosis not present

## 2023-08-02 DIAGNOSIS — I509 Heart failure, unspecified: Secondary | ICD-10-CM | POA: Diagnosis not present

## 2023-08-02 DIAGNOSIS — N1832 Chronic kidney disease, stage 3b: Secondary | ICD-10-CM | POA: Diagnosis not present

## 2023-08-02 DIAGNOSIS — I872 Venous insufficiency (chronic) (peripheral): Secondary | ICD-10-CM | POA: Diagnosis not present

## 2023-08-02 DIAGNOSIS — R7303 Prediabetes: Secondary | ICD-10-CM | POA: Diagnosis not present

## 2023-08-02 DIAGNOSIS — G3184 Mild cognitive impairment, so stated: Secondary | ICD-10-CM | POA: Diagnosis not present

## 2023-08-03 DIAGNOSIS — I5032 Chronic diastolic (congestive) heart failure: Secondary | ICD-10-CM | POA: Diagnosis not present

## 2023-08-04 DIAGNOSIS — R0602 Shortness of breath: Secondary | ICD-10-CM | POA: Diagnosis not present

## 2023-08-04 DIAGNOSIS — I10 Essential (primary) hypertension: Secondary | ICD-10-CM | POA: Diagnosis not present

## 2023-08-04 DIAGNOSIS — I5032 Chronic diastolic (congestive) heart failure: Secondary | ICD-10-CM | POA: Diagnosis not present

## 2023-08-24 DIAGNOSIS — I872 Venous insufficiency (chronic) (peripheral): Secondary | ICD-10-CM | POA: Diagnosis not present

## 2023-08-24 DIAGNOSIS — N1831 Chronic kidney disease, stage 3a: Secondary | ICD-10-CM | POA: Diagnosis not present

## 2023-08-24 DIAGNOSIS — I10 Essential (primary) hypertension: Secondary | ICD-10-CM | POA: Diagnosis not present

## 2023-08-24 DIAGNOSIS — N184 Chronic kidney disease, stage 4 (severe): Secondary | ICD-10-CM | POA: Diagnosis not present

## 2023-08-24 DIAGNOSIS — E8779 Other fluid overload: Secondary | ICD-10-CM | POA: Diagnosis not present

## 2023-08-24 DIAGNOSIS — R600 Localized edema: Secondary | ICD-10-CM | POA: Diagnosis not present

## 2023-08-31 DIAGNOSIS — N1831 Chronic kidney disease, stage 3a: Secondary | ICD-10-CM | POA: Diagnosis not present

## 2023-08-31 DIAGNOSIS — H409 Unspecified glaucoma: Secondary | ICD-10-CM | POA: Diagnosis not present

## 2023-08-31 DIAGNOSIS — I872 Venous insufficiency (chronic) (peripheral): Secondary | ICD-10-CM | POA: Diagnosis not present

## 2023-08-31 DIAGNOSIS — F3341 Major depressive disorder, recurrent, in partial remission: Secondary | ICD-10-CM | POA: Diagnosis not present

## 2023-08-31 DIAGNOSIS — E039 Hypothyroidism, unspecified: Secondary | ICD-10-CM | POA: Diagnosis not present

## 2023-08-31 DIAGNOSIS — I10 Essential (primary) hypertension: Secondary | ICD-10-CM | POA: Diagnosis not present

## 2023-08-31 DIAGNOSIS — N184 Chronic kidney disease, stage 4 (severe): Secondary | ICD-10-CM | POA: Diagnosis not present

## 2023-11-17 ENCOUNTER — Other Ambulatory Visit (INDEPENDENT_AMBULATORY_CARE_PROVIDER_SITE_OTHER): Payer: Self-pay | Admitting: Podiatry

## 2023-11-17 DIAGNOSIS — I739 Peripheral vascular disease, unspecified: Secondary | ICD-10-CM

## 2023-11-21 ENCOUNTER — Encounter (INDEPENDENT_AMBULATORY_CARE_PROVIDER_SITE_OTHER)

## 2023-11-25 ENCOUNTER — Ambulatory Visit (INDEPENDENT_AMBULATORY_CARE_PROVIDER_SITE_OTHER)

## 2023-11-25 DIAGNOSIS — I739 Peripheral vascular disease, unspecified: Secondary | ICD-10-CM

## 2023-12-01 LAB — VAS US ABI WITH/WO TBI
Left ABI: 0.5
Right ABI: 0.41

## 2023-12-23 ENCOUNTER — Ambulatory Visit: Admitting: Physician Assistant

## 2023-12-27 ENCOUNTER — Ambulatory Visit: Payer: PPO | Admitting: Dermatology

## 2024-01-31 ENCOUNTER — Encounter (INDEPENDENT_AMBULATORY_CARE_PROVIDER_SITE_OTHER): Payer: Self-pay

## 2024-04-03 NOTE — Progress Notes (Signed)
 [facility] - [facility]  Chief complaint: Follow-up   Subjective: Felicia Weaver is a 89 y.o. female here for f/u visit. History of Present Illness Felicia Weaver Devanny Palecek is a 89 year old female who presents with discomfort and pain due to hemorrhoids.  She experiences significant discomfort and pain from hemorrhoids, describing them as very painful and likening them to 'a group of grapes' or 'cauliflower'. Sitting is particularly uncomfortable, and the hemorrhoids cause intense pain.  Episodes of diarrhea and constipation exacerbate her hemorrhoid symptoms. Diarrhea leads to frequent wiping, causing irritation, while constipation results in straining, further aggravating the condition. There is no consistent pattern to her bowel movements, and she sometimes cannot reach the bathroom in time.  Her current medication regimen includes Lasix, which she takes Monday through Friday. She no longer wears compression socks as her leg swelling has decreased significantly.  She has a history of hearing difficulties, and her caregiver notes that she does not hear well. She has previously declined hearing aids.  No other problems aside from the hemorrhoids.    Current Outpatient Medications:  .  acetaminophen (TYLENOL) 500 MG tablet, Take 500 mg by mouth every 6 (six) hours as needed, Disp: , Rfl:  .  FUROsemide (LASIX) 40 MG tablet, Take 1 tablet (40 mg total) by mouth once daily (Patient taking differently: Take 40 mg by mouth once daily Pt. Is Only Taking Mon-Friday ( 5 Days A Week)), Disp: 30 tablet, Rfl: 11 .  losartan (COZAAR) 25 MG tablet, Take 1 tablet (25 mg total) by mouth once daily for 360 days, Disp: 90 tablet, Rfl: 3 .  potassium chloride (KLOR-CON) 10 MEQ ER tablet, Take 1 tablet (10 mEq total) by mouth 2 (two) times daily (Patient taking differently: Take 10 mEq by mouth 2 (two) times daily Takes Only The Days She Takes The Lasix.), Disp: 60 tablet, Rfl: 11 .  metroNIDAZOLE   (METROCREAM ) 0.75 % cream, Apply to face qd-bid (Patient not taking: Reported on 08/24/2023), Disp: , Rfl:   ROS reviewed: General ROS:  Negative for  - fatigue, fevers, chills HEENT ROS: negative for seasonal allergies, congestion, cough Respiratory ROS: negative for - cough, SOB, wheezing Cardiovascular ROS: no chest pain or dyspnea on exertion Gastrointestinal ROS: negative for constipation, N/V,D Genito-Urinary ROS: no dysuria, trouble voiding, or hematuria Musculoskeletal ROS: negative for joint pain, swelling Neurological ROS: negative for headaches, dizzinesss Dermatological ROS: negative for rash or skin lesion    Psychological ROS: negative for depression or anxiety  Allergies  Allergen Reactions  . Alphagan P [Brimonidine] Other (See Comments)    Not Assessed  . Betagan [Levobunolol] Other (See Comments)    Not Assessed  . Brimonidine Tartrate Other (See Comments)    Not Assessed  Itching eyes  . Others Other (See Comments)    PROPINE. PROPINE - Not Assessed  . Sulfa (Sulfonamide Antibiotics) Nausea  . Sulfasalazine Nausea    Social History   Tobacco Use  . Smoking status: Never  . Smokeless tobacco: Never  Vaping Use  . Vaping status: Never Used  Substance Use Topics  . Alcohol use: Not Currently  . Drug use: Defer      Objective:  BP 110/62 (BP Location: Left upper arm, Patient Position: Sitting, BP Cuff Size: Small Adult)   Pulse 68   Ht 160 cm (5' 3)   Wt 63.5 kg (140 lb)   SpO2 98%   BMI 24.80 kg/m  reviewed. Gen: AAOx3. Well-developed and well-nourished. NAD.  HEENT:  HEAD NORMOCEPHALIC.  PERRLA, EOM intact.  No thyromegaly present. No lymphadpathy. Cardiovascular: Normal rate, regular rhythm. Normal S1 and S2 without murmus, rubs or gallops.  Pulmonary/Chest: CTAB. Effort normal and breath sounds normal. No respiratory distress. No wheezes or rales. Abdomen: Soft. Bowel sounds are normal. No distension or tenderness. Multiple hemorrhoids  noted. Neuro: Cranial nervess II-XII intact.Nonfocal exam. Skin: Skin is warm and dry.  Musculoskeletal: Normal range of motion. No edema, no tenderness.  Psychiatric: Normal mood and affect. Her behavior is normal. Judgment and thought content normal  Assessment and Plan: Assessment & Plan Hemorrhoids External hemorrhoids causing significant discomfort. Non-surgical treatments preferred due to age and anesthesia risks. - Refer to general surgeon for evaluation and  treatment options. - Advise sitz baths for relief. - Recommend hot compresses for symptomatic relief.  Venous insufficiency Leg swelling improved, allowing Lasix reduction. Compression socks discontinued. Monitoring kidney function with adjusted Lasix regimen. - Reduce Lasix to Monday, Wednesday, Friday. - Monitor leg swelling and adjust treatment as needed. - Discontinue compression socks. - Order blood work to assess kidney function.  Hearing impairment Declined hearing aids. Left ear blockage contributing to hearing difficulties. Non-audiologist technological solutions suggested. - Recommend audiologist evaluation for hearing options. - Irrigate right ear to address blockage.  Goals of Care Philippe Sous added to power of attorney for continuity of care and decision-making. - Update power of attorney to include Philippe Sous.   CKD (chronic kidney disease) stage 4, GFR 15-29 ml/min (CMS/HHS-HCC)  (primary encounter diagnosis) Plan: Renal Function Panel (RFP)  Gait instability  Adult hypothyroidism Plan: Thyroid  Stimulating-Hormone (TSH)  Major depressive disorder, single episode, mild ()  Perianal venous thrombosis Plan: Ambulatory Referral to General Surgery,        CANCELED: Ambulatory Referral to General        Surgery  Hemorrhoids, unspecified hemorrhoid type Plan: Ambulatory Referral to General Surgery,        CANCELED: Ambulatory Referral to General        Surgery      This note has been created  using automated tools and reviewed for accuracy by RICHARD LESLIE GILBERT.

## 2024-04-23 ENCOUNTER — Ambulatory Visit: Admitting: Surgery

## 2024-04-23 ENCOUNTER — Encounter: Payer: Self-pay | Admitting: Surgery

## 2024-04-23 VITALS — BP 150/75 | HR 76 | Temp 97.6°F | Ht 63.0 in | Wt 143.4 lb

## 2024-04-23 DIAGNOSIS — K642 Third degree hemorrhoids: Secondary | ICD-10-CM | POA: Diagnosis not present

## 2024-04-23 DIAGNOSIS — K643 Fourth degree hemorrhoids: Secondary | ICD-10-CM

## 2024-04-23 MED ORDER — LIDOCAINE 5 % EX OINT: 1.0000 | TOPICAL_OINTMENT | CUTANEOUS | 0 refills | Status: AC | PRN

## 2024-04-23 NOTE — Patient Instructions (Signed)
 Hemorrhoids Hemorrhoids are swollen veins that may form: In the butt (rectum). These are called internal hemorrhoids. Around the opening of the butt (anus). These are called external hemorrhoids. Most hemorrhoids do not cause very bad problems. They often get better with changes to your lifestyle and what you eat. What are the causes? Having trouble pooping (constipation) or watery poop (diarrhea). Pushing too hard when you poop. Pregnancy. Being very overweight (obese). Sitting for too long. Riding a bike for a long time. Heavy lifting or other things that take a lot of effort. Anal sex. What are the signs or symptoms? Pain. Itching or soreness in the butt. Bleeding from the butt. Leaking poop. Swelling. One or more lumps around the opening of your butt. How is this treated? In most cases, hemorrhoids can be treated at home. You may be told to: Change what you eat. Make changes to your lifestyle. If these treatments do not help, you may need to have a procedure done. Your doctor may need to: Place rubber bands at the bottom of the hemorrhoids to make them fall off. Put medicine into the hemorrhoids to shrink them. Shine a type of light on the hemorrhoids to cause them to fall off. Do surgery to get rid of the hemorrhoids. Follow these instructions at home: Medicines Take over-the-counter and prescription medicines only as told by your doctor. Use creams with medicine in them or medicines that you put in your butt as told by your doctor. Eating and drinking  Eat foods that have a lot of fiber in them. These include whole grains, beans, nuts, fruits, and vegetables. Ask your doctor about taking products that have fiber added to them (fibersupplements). Take in less fat. You can do this by: Eating low-fat dairy products. Eating less red meat. Staying away from processed foods. Drink enough fluid to keep your pee (urine) pale yellow. Managing pain and swelling  Take a  warm-water bath (sitz bath) for 20 minutes to ease pain. Do this 3-4 times a day. You may do this in a bathtub. You may also use a portable sitz bath that fits over the toilet. If told, put ice on the painful area. It may help to use ice between your warm baths. Put ice in a plastic bag. Place a towel between your skin and the bag. Leave the ice on for 20 minutes, 2-3 times a day. If your skin turns bright red, take off the ice right away to prevent skin damage. The risk of damage is higher if you cannot feel pain, heat, or cold. General instructions Exercise. Ask your doctor how much and what kind of exercise is best for you. Go to the bathroom when you need to poop. Do not wait. Try not to push too hard when you poop. Keep your butt dry and clean. Use wet toilet paper or moist towelettes after you poop. Do not sit on the toilet for a long time. Contact a doctor if: You have pain and swelling that do not get better with treatment. You have trouble pooping. You cannot poop. You have pain or swelling outside the area of the hemorrhoids. Get help right away if: You have bleeding from the butt that will not stop. This information is not intended to replace advice given to you by your health care provider. Make sure you discuss any questions you have with your health care provider. Document Revised: 05/12/2022 Document Reviewed: 05/12/2022 Elsevier Patient Education  2024 [employer].

## 2024-04-24 ENCOUNTER — Encounter: Payer: Self-pay | Admitting: Surgery

## 2024-04-24 NOTE — Progress Notes (Signed)
 Patient ID: Felicia Weaver, female   DOB: 1925/07/03, 89 y.o.   MRN: [id number]  HPI Felicia Weaver is a 89 y.o. female seen in consultation at the request of Dr. Bertrum.  She reports chronic rectal pain that is mild to moderate intermittently worsening when having a bowel movement.  She does have some fecal incontinence that is intermittent.  She is 89 she uses a walker and has significant mobility issues.  She is accompanied by her daughter She does have chronic kidney disease with creatinine of 1.8, CBC shows a hemoglobin of 11. Does report some intermittent hematochezia. HPI  Past Medical History:  Diagnosis Date   Fracture    right arm   Glaucoma    Hyperlipidemia    Hypertension    Squamous cell carcinoma of skin 12/17/2020   Left cheek, [facility]   Squamous cell carcinoma of skin 06/22/2023   Right forearm, [facility]    Past Surgical History:  Procedure Laterality Date   BREAST BIOPSY Left 07-30-13   stereotatic   BREAST BIOPSY Left 08/28/13   benign/Flat epithelial atypia without severe dysplasia or malignancy. Surgical margins free of atypia   CHOLECYSTECTOMY  Nov 2012   HAND SURGERY     MOHS SURGERY      Family History  Problem Relation Age of Onset   Hypertension Sister    Dementia Sister    Dementia Mother    Stroke Father    Heart attack Brother    Cancer Brother    Stroke Brother    Heart disease Sister     Social History Social History   Tobacco Use   Smoking status: Never   Smokeless tobacco: Never  Vaping Use   Vaping status: Never Used  Substance Use Topics   Alcohol use: No   Drug use: No    Allergies  Allergen Reactions   Betagan  [Levobunolol Hcl] Other (See Comments)    Not Assessed   Brimonidine Tartrate Other (See Comments)    Not Assessed Itching eyes   Other Other (See Comments)    PROPINE. PROPINE -itching eyes   Sulfa Antibiotics Nausea Only    Current Outpatient Medications  Medication Sig Dispense Refill   furosemide (LASIX) 40 MG  tablet Take 40 mg by mouth daily.     lidocaine  (XYLOCAINE ) 5 % ointment Apply 1 Application topically as needed. 35.44 g 0   losartan (COZAAR) 25 MG tablet Take 25 mg by mouth daily.     MAGNESIUM PO Take by mouth daily. Unsure dose     metroNIDAZOLE  (METROCREAM ) 0.75 % cream Apply to face qd-bid 45 g 2   mometasone  (ELOCON ) 0.1 % cream Apply 1 application topically daily. 45 g 0   potassium chloride (KLOR-CON) 10 MEQ tablet Take 10 mEq by mouth 2 (two) times daily.     No current facility-administered medications for this visit.     Review of Systems Full ROS  was asked and was negative except for the information on the HPI  Physical Exam Blood pressure (!) 150/75, pulse 76, temperature 97.6 F (36.4 C), temperature source Oral, height 5' 3 (1.6 m), weight 143 lb 6.4 oz (65 kg), SpO2 97%. CONSTITUTIONAL: Chronically ill and debilitated female.  She uses a walker walker EYES: Pupils are equal, round, Sclera are non-icteric. EARS, NOSE, MOUTH AND THROAT: The oropharynx is clear. The oral mucosa is pink and moist. Hearing is intact to voice. LYMPH NODES:  Lymph nodes in the neck are normal. RESPIRATORY:  Lungs are clear. There is normal respiratory effort, with equal breath sounds bilaterally, and without pathologic use of accessory muscles. CARDIOVASCULAR: Heart is regular without murmurs, gallops, or rubs. GI: The abdomen is  soft, nontender, and nondistended. There are no palpable masses. There is no hepatosplenomegaly. There are normal bowel sounds in all quadrants. GU: Rectal there is evidence of circumferential Grade IV internal  hemorrhoids.  I was able to push them and reduce them.  No evidence of active bleeding.  No masses.  Very weak sphincter tone MUSCULOSKELETAL: Normal muscle strength and tone. No cyanosis or edema.   SKIN: Turgor is good and there are no pathologic skin lesions or ulcers. NEUROLOGIC: Motor and sensation is grossly normal. Cranial nerves are grossly  intact. PSYCH:  Oriented to person, place and time. Affect is normal.  Data Reviewed I have personally reviewed the patient's imaging, laboratory findings and medical records.    Assessment/Plan 89 year old debilitated female with grade 3 circumferential internal hemorrhoids.  Cussed with the patient and the daughter in detail regarding her disease process.  Unfortunately do think that her hemorrhoids are largely as a result of pelvic dysfunction.  She does have some incontinence to support a diagnosis.  I do not recommend any hemorrhoidectomy at this time.  Will recommend sitz bath's and medical management with fiber and potential lidocaine  cream.  Unfortunately situation but I do think that putting her through general anesthetic will be more harmful than medical management.  All questions were answered Daughter is in complete agreement. I personally spent a total of 45 minutes in the care of the patient today including performing a medically appropriate exam/evaluation, counseling and educating, placing orders, referring and communicating with other health care professionals, documenting clinical information in the EHR, independently interpreting and reviewing images studies and coordinating care.    Laneta Luna, MD FACS General Surgeon 04/24/2024, 1:42 PM

## 2024-05-30 ENCOUNTER — Inpatient Hospital Stay
Admission: EM | Admit: 2024-05-30 | Discharge: 2024-06-04 | DRG: 563 | Disposition: A | Discharge status: Skilled Nursing Facility | Source: Ambulatory Visit | Attending: Internal Medicine | Admitting: Internal Medicine

## 2024-05-30 ENCOUNTER — Other Ambulatory Visit: Payer: Self-pay

## 2024-05-30 ENCOUNTER — Emergency Department

## 2024-05-30 DIAGNOSIS — N183 Chronic kidney disease, stage 3 unspecified: Secondary | ICD-10-CM | POA: Diagnosis present

## 2024-05-30 DIAGNOSIS — Z85828 Personal history of other malignant neoplasm of skin: Secondary | ICD-10-CM

## 2024-05-30 DIAGNOSIS — N179 Acute kidney failure, unspecified: Secondary | ICD-10-CM | POA: Diagnosis present

## 2024-05-30 DIAGNOSIS — H5462 Unqualified visual loss, left eye, normal vision right eye: Secondary | ICD-10-CM | POA: Diagnosis present

## 2024-05-30 DIAGNOSIS — S41112A Laceration without foreign body of left upper arm, initial encounter: Secondary | ICD-10-CM | POA: Diagnosis present

## 2024-05-30 DIAGNOSIS — I10 Essential (primary) hypertension: Secondary | ICD-10-CM | POA: Diagnosis present

## 2024-05-30 DIAGNOSIS — Z79899 Other long term (current) drug therapy: Secondary | ICD-10-CM | POA: Diagnosis not present

## 2024-05-30 DIAGNOSIS — Z888 Allergy status to other drugs, medicaments and biological substances status: Secondary | ICD-10-CM | POA: Diagnosis not present

## 2024-05-30 DIAGNOSIS — Z8249 Family history of ischemic heart disease and other diseases of the circulatory system: Secondary | ICD-10-CM

## 2024-05-30 DIAGNOSIS — Z66 Do not resuscitate: Secondary | ICD-10-CM | POA: Diagnosis present

## 2024-05-30 DIAGNOSIS — W010XXA Fall on same level from slipping, tripping and stumbling without subsequent striking against object, initial encounter: Secondary | ICD-10-CM | POA: Diagnosis present

## 2024-05-30 DIAGNOSIS — F32A Depression, unspecified: Secondary | ICD-10-CM | POA: Diagnosis present

## 2024-05-30 DIAGNOSIS — E039 Hypothyroidism, unspecified: Secondary | ICD-10-CM | POA: Diagnosis present

## 2024-05-30 DIAGNOSIS — M25512 Pain in left shoulder: Secondary | ICD-10-CM | POA: Diagnosis present

## 2024-05-30 DIAGNOSIS — E785 Hyperlipidemia, unspecified: Secondary | ICD-10-CM | POA: Diagnosis present

## 2024-05-30 DIAGNOSIS — Z789 Other specified health status: Secondary | ICD-10-CM | POA: Diagnosis not present

## 2024-05-30 DIAGNOSIS — S42209A Unspecified fracture of upper end of unspecified humerus, initial encounter for closed fracture: Secondary | ICD-10-CM | POA: Diagnosis present

## 2024-05-30 DIAGNOSIS — S42202A Unspecified fracture of upper end of left humerus, initial encounter for closed fracture: Secondary | ICD-10-CM | POA: Diagnosis not present

## 2024-05-30 DIAGNOSIS — W19XXXA Unspecified fall, initial encounter: Secondary | ICD-10-CM | POA: Diagnosis not present

## 2024-05-30 DIAGNOSIS — I129 Hypertensive chronic kidney disease with stage 1 through stage 4 chronic kidney disease, or unspecified chronic kidney disease: Secondary | ICD-10-CM | POA: Diagnosis present

## 2024-05-30 DIAGNOSIS — Z9049 Acquired absence of other specified parts of digestive tract: Secondary | ICD-10-CM

## 2024-05-30 DIAGNOSIS — H409 Unspecified glaucoma: Secondary | ICD-10-CM | POA: Diagnosis present

## 2024-05-30 DIAGNOSIS — N1832 Chronic kidney disease, stage 3b: Secondary | ICD-10-CM | POA: Diagnosis not present

## 2024-05-30 DIAGNOSIS — Z882 Allergy status to sulfonamides status: Secondary | ICD-10-CM

## 2024-05-30 DIAGNOSIS — S42292A Other displaced fracture of upper end of left humerus, initial encounter for closed fracture: Principal | ICD-10-CM | POA: Diagnosis present

## 2024-05-30 DIAGNOSIS — E7849 Other hyperlipidemia: Secondary | ICD-10-CM | POA: Diagnosis not present

## 2024-05-30 DIAGNOSIS — Z515 Encounter for palliative care: Secondary | ICD-10-CM | POA: Diagnosis not present

## 2024-05-30 DIAGNOSIS — Y92009 Unspecified place in unspecified non-institutional (private) residence as the place of occurrence of the external cause: Secondary | ICD-10-CM

## 2024-05-30 DIAGNOSIS — Z7189 Other specified counseling: Secondary | ICD-10-CM | POA: Diagnosis not present

## 2024-05-30 LAB — BASIC METABOLIC PANEL WITH GFR
Anion gap: 8 (ref 5–15)
BUN: 24 mg/dL — ABNORMAL HIGH (ref 8–23)
CO2: 26 mmol/L (ref 22–32)
Calcium: 9.8 mg/dL (ref 8.9–10.3)
Chloride: 102 mmol/L (ref 98–111)
Creatinine, Ser: 1.59 mg/dL — ABNORMAL HIGH (ref 0.44–1.00)
GFR, Estimated: 29 mL/min — ABNORMAL LOW (ref >60)
Glucose, Bld: 119 mg/dL — ABNORMAL HIGH (ref 70–99)
Potassium: 5 mmol/L (ref 3.5–5.1)
Sodium: 136 mmol/L (ref 135–145)

## 2024-05-30 LAB — CBC WITH DIFFERENTIAL/PLATELET
Abs Immature Granulocytes: 0.05 K/uL (ref 0.00–0.07)
Basophils Absolute: 0 K/uL (ref 0.0–0.1)
Basophils Relative: 0 %
Eosinophils Absolute: 0 K/uL (ref 0.0–0.5)
Eosinophils Relative: 0 %
HCT: 34.6 % — ABNORMAL LOW (ref 36.0–46.0)
Hemoglobin: 11.1 g/dL — ABNORMAL LOW (ref 12.0–15.0)
Immature Granulocytes: 1 %
Lymphocytes Relative: 7 %
Lymphs Abs: 0.7 K/uL (ref 0.7–4.0)
MCH: 30.7 pg (ref 26.0–34.0)
MCHC: 32.1 g/dL (ref 30.0–36.0)
MCV: 95.8 fL (ref 80.0–100.0)
Monocytes Absolute: 0.4 K/uL (ref 0.1–1.0)
Monocytes Relative: 5 %
Neutro Abs: 8.7 K/uL — ABNORMAL HIGH (ref 1.7–7.7)
Neutrophils Relative %: 87 %
Platelets: 177 K/uL (ref 150–400)
RBC: 3.61 MIL/uL — ABNORMAL LOW (ref 3.87–5.11)
RDW: 13.1 % (ref 11.5–15.5)
WBC: 9.9 K/uL (ref 4.0–10.5)
nRBC: 0 % (ref 0.0–0.2)

## 2024-05-30 LAB — CBC
HCT: 31.7 % — ABNORMAL LOW (ref 36.0–46.0)
Hemoglobin: 10.3 g/dL — ABNORMAL LOW (ref 12.0–15.0)
MCH: 31 pg (ref 26.0–34.0)
MCHC: 32.5 g/dL (ref 30.0–36.0)
MCV: 95.5 fL (ref 80.0–100.0)
Platelets: 165 K/uL (ref 150–400)
RBC: 3.32 MIL/uL — ABNORMAL LOW (ref 3.87–5.11)
RDW: 13 % (ref 11.5–15.5)
WBC: 9 K/uL (ref 4.0–10.5)
nRBC: 0 % (ref 0.0–0.2)

## 2024-05-30 LAB — CREATININE, SERUM
Creatinine, Ser: 1.34 mg/dL — ABNORMAL HIGH (ref 0.44–1.00)
GFR, Estimated: 36 mL/min — ABNORMAL LOW (ref >60)

## 2024-05-30 MED ORDER — ACETAMINOPHEN 650 MG RE SUPP: 650.0000 mg | Freq: Four times a day (QID) | RECTAL | Status: DC | PRN

## 2024-05-30 MED ORDER — MORPHINE SULFATE (PF) 2 MG/ML IV SOLN
2.0000 mg | INTRAVENOUS | Status: DC | PRN
  Administered 2024-05-31: 2 mg via INTRAVENOUS
  Filled 2024-05-30: qty 1

## 2024-05-30 MED ORDER — ENOXAPARIN SODIUM 30 MG/0.3ML IJ SOSY
30.0000 mg | PREFILLED_SYRINGE | Freq: Every day | INTRAMUSCULAR | Status: DC
  Administered 2024-05-30 – 2024-06-03 (×5): 30 mg via SUBCUTANEOUS
  Filled 2024-05-30 (×5): qty 0.3

## 2024-05-30 MED ORDER — DEXTROSE IN LACTATED RINGERS 5 % IV SOLN: INTRAVENOUS | Status: AC

## 2024-05-30 MED ORDER — ONDANSETRON HCL 4 MG PO TABS: 4.0000 mg | ORAL_TABLET | Freq: Four times a day (QID) | ORAL | Status: DC | PRN

## 2024-05-30 MED ORDER — ACETAMINOPHEN 325 MG PO TABS
650.0000 mg | ORAL_TABLET | Freq: Four times a day (QID) | ORAL | Status: DC | PRN
  Administered 2024-05-31 – 2024-06-03 (×3): 650 mg via ORAL
  Filled 2024-05-30 (×3): qty 2

## 2024-05-30 MED ORDER — OXYCODONE-ACETAMINOPHEN 5-325 MG PO TABS
1.0000 | ORAL_TABLET | Freq: Once | ORAL | Status: AC
  Administered 2024-05-30: 1 via ORAL
  Filled 2024-05-30: qty 1

## 2024-05-30 MED ORDER — ONDANSETRON HCL 4 MG/2ML IJ SOLN: 4.0000 mg | Freq: Four times a day (QID) | INTRAMUSCULAR | Status: DC | PRN

## 2024-05-30 NOTE — H&P (Signed)
 History and Physical    Patient: Felicia Weaver FMW:[id number] DOB: 1925/03/26 DOA: 05/30/2024 DOS: the patient was seen and examined on 05/30/2024 PCP: Bertrum Charlie CROME, MD  Patient coming from: Home  Chief Complaint:  Chief Complaint  Patient presents with   Shoulder Pain   HPI: Felicia Weaver is a 89 y.o. female with medical history significant of essential hypertension, hyperlipidemia, squamous cell carcinoma of the skin, glaucoma, history of right shoulder injury with limitation of range of motion who presented to the ER with mechanical fall today at home falling onto her left side and having significant proximal humeral head fracture.  Patient went to see her orthopedics at [facility] today where she was evaluated.  She is not a surgical candidate.  She was sent over to the ER for further evaluation and possible admission and placement.  She is unable to use both arms.  Also in pain currently 6 out of 10.  She otherwise has been able to carry on with her ADLs despite her age until now.  She lives alone although has family support.  Patient is therefore being admitted for evaluation and treatment and possible placement.  Review of Systems: As mentioned in the history of present illness. All other systems reviewed and are negative. Past Medical History:  Diagnosis Date   Fracture    right arm   Glaucoma    Hyperlipidemia    Hypertension    Squamous cell carcinoma of skin 12/17/2020   Left cheek, [facility]   Squamous cell carcinoma of skin 06/22/2023   Right forearm, [facility]   Past Surgical History:  Procedure Laterality Date   BREAST BIOPSY Left 07-30-13   stereotatic   BREAST BIOPSY Left 08/28/13   benign/Flat epithelial atypia without severe dysplasia or malignancy. Surgical margins free of atypia   CHOLECYSTECTOMY  Nov 2012   HAND SURGERY     MOHS SURGERY     Social History:  reports that she has never smoked. She has never used smokeless tobacco. She reports that she does not  drink alcohol and does not use drugs.  Allergies  Allergen Reactions   Betagan  [Levobunolol Hcl] Other (See Comments)    Not Assessed   Brimonidine Tartrate Other (See Comments)    Not Assessed Itching eyes   Other Other (See Comments)    PROPINE. PROPINE -itching eyes   Sulfa Antibiotics Nausea Only    Family History  Problem Relation Age of Onset   Hypertension Sister    Dementia Sister    Dementia Mother    Stroke Father    Heart attack Brother    Cancer Brother    Stroke Brother    Heart disease Sister     Prior to Admission medications   Medication Sig Start Date End Date Taking? Authorizing Provider  acetaminophen  (TYLENOL ) 500 MG tablet Take 500 mg by mouth every 6 (six) hours as needed.    [provider]  furosemide (LASIX) 40 MG tablet Take 40 mg by mouth daily.    [provider]  lidocaine  (XYLOCAINE ) 5 % ointment Apply 1 Application topically as needed. 04/23/24   Pabon, Diego F, MD  losartan (COZAAR) 25 MG tablet Take 25 mg by mouth daily.    [provider]  MAGNESIUM  PO Take by mouth daily. Unsure dose    [provider]  metroNIDAZOLE  (METROCREAM ) 0.75 % cream Apply to face qd-bid 06/22/23   Jackquline Sawyer, MD  mometasone  (ELOCON ) 0.1 % cream Apply 1  application topically daily. 11/25/20   Bertrum Charlie CROME, MD  potassium chloride (KLOR-CON) 10 MEQ tablet Take 10 mEq by mouth 2 (two) times daily.    [provider]    Physical Exam: Vitals:   05/30/24 1447  BP: (!) 151/56  Pulse: 65  Resp: 18  Temp: 98 F (36.7 C)  TempSrc: Oral  SpO2: 98%   Constitutional: Acutely ill looking, NAD, calm, comfortable Eyes: PERRL, lids and conjunctivae normal ENMT: Mucous membranes are moist. Posterior pharynx clear of any exudate or lesions.Normal dentition.  Neck: normal, supple, no masses, no thyromegaly Respiratory: clear to auscultation bilaterally, no wheezing, no crackles. Normal respiratory effort. No accessory  muscle use.  Cardiovascular: Regular rate and rhythm, no murmurs / rubs / gallops. No extremity edema. 2+ pedal pulses. No carotid bruits.  Abdomen: no tenderness, no masses palpated. No hepatosplenomegaly. Bowel sounds positive.  Musculoskeletal: Left upper extremity immobilized and in a sling, right upper extremity limitation of radial portion  Skin: no rashes, lesions, ulcers. No induration Neurologic: CN 2-12 grossly intact. Sensation intact, DTR normal. Strength 5/5 in all 4.  Psychiatric: Normal judgment and insight. Alert and oriented x 3. Normal mood  Data Reviewed:  Blood pressure 115/56, white count 9.1 hemoglobin 10.3, BUN 24 creatinine 1.34 glucose 119 x-ray of the left ankle showed no acute fracture of the left forearm wrist of the left shoulder shows comminuted displaced and impacted proximal humerus fracture  Assessment and Plan:  #1 status post fall: Mechanical fall.  Patient is currently fully awake and alert no complaint.  We will admit.  Get PT and OT consultation.  Continue to monitor.  Continue to monitor.  #2 left comminuted proximal humeral fracture: Patient is not a surgical candidate.  Unable to use hand.  Will take weeks to heal.  Will get PT and OT consult.  Orthopedic has already seen patient in the outpatient.  Patient may likely require skilled placement until it heals.  #3 essential hypertension: Continue home regimen.  #4 hyperlipidemia: Will confirm and resume home regimen 2.  #5 AKI on CKD 3: Continue hydration and monitor  #6 hypothyroidism: Continue levothyroxine   Advance Care Planning:   Code Status: Full Code   Consults: PT and OT and possible orthopedic consult  Family Communication: Daughters at bedside  Severity of Illness: The appropriate patient status for this patient is INPATIENT. Inpatient status is judged to be reasonable and necessary in order to provide the required intensity of service to ensure the patient's safety. The patient's  presenting symptoms, physical exam findings, and initial radiographic and laboratory data in the context of their chronic comorbidities is felt to place them at high risk for further clinical deterioration. Furthermore, it is not anticipated that the patient will be medically stable for discharge from the hospital within 2 midnights of admission.   * I certify that at the point of admission it is my clinical judgment that the patient will require inpatient hospital care spanning beyond 2 midnights from the point of admission due to high intensity of service, high risk for further deterioration and high frequency of surveillance required.*  AuthorBETHA SIM KNOLL, MD 05/30/2024 6:55 PM  For on call review [email or web address].

## 2024-05-30 NOTE — ED Notes (Signed)
 Pt moved from room 12 to 34, transferred from stretcher to hospital bed. Pt placed on purwick and suction per policy. Call light within reach and family at bedside.  Float RN

## 2024-05-30 NOTE — Progress Notes (Signed)
 PHARMACIST - PHYSICIAN COMMUNICATION  CONCERNING:  Enoxaparin  (Lovenox ) for DVT Prophylaxis    RECOMMENDATION: Patient was prescribed enoxaprin 40mg  q24 hours for VTE prophylaxis.   Filed Weights   05/30/24 2045  Weight: 65 kg (143 lb 4.8 oz)    Body mass index is 25.38 kg/m.  Estimated Creatinine Clearance: 21.7 mL/min (A) (by C-G formula based on SCr of 1.34 mg/dL (H)).   Patient is candidate for enoxaparin  30mg  every 24 hours based on CrCl <28ml/min or Weight <45kg  DESCRIPTION: Pharmacy has adjusted enoxaparin  dose per [facility] policy.  Patient is now receiving enoxaparin  30 mg every 24 hours    Felicia Weaver, PharmD Clinical Pharmacist  05/30/2024 9:07 PM

## 2024-05-30 NOTE — ED Triage Notes (Addendum)
 Pt to ED via POV from [facility]. Pt had a fall today. Pt reports left shoulder pain. Pt seen at [facility] and sent over here for fx. Pt has bruising large skin tear to left arm. No blood thinner.

## 2024-05-30 NOTE — ED Provider Notes (Signed)
 [facility] Provider Note    Event Date/Time   First MD Initiated Contact with Patient 05/30/24 1641     (approximate)   History   Chief Complaint Shoulder Pain   HPI  Felicia Weaver is a 89 y.o. female with past medical history of hypertension, hyperlipidemia, and hypothyroidism who presents to the ED complaining of shoulder pain.  Sister at bedside reports that patient had a trip and fall earlier today at home, striking her left shoulder.  She believes she may have hit her head, but denies losing consciousness and does not take a blood thinner.  She reports significant pain in her left shoulder and also noticed a large skin tear to her left upper arm.  She denies significant pain at her left elbow or wrist, also denies any pain to her chest, abdomen, or lower extremities.  She was initially evaluated at [facility], found to have fracture of the proximal humerus.     Physical Exam   Triage Vital Signs: ED Triage Vitals [05/30/24 1447]  Encounter Vitals Group     BP (!) 151/56     Girls Systolic BP Percentile      Girls Diastolic BP Percentile      Boys Systolic BP Percentile      Boys Diastolic BP Percentile      Pulse Rate 65     Resp 18     Temp 98 F (36.7 C)     Temp Source Oral     SpO2 98 %     Weight      Height      Head Circumference      Peak Flow      Pain Score      Pain Loc      Pain Education      Exclude from Growth Chart     Most recent vital signs: Vitals:   05/30/24 1447  BP: (!) 151/56  Pulse: 65  Resp: 18  Temp: 98 F (36.7 C)  SpO2: 98%    Constitutional: Alert and oriented. Eyes: Conjunctivae are normal. Head: Atraumatic. Neck: No midline cervical spine tenderness to palpation. Nose: No congestion/rhinnorhea. Mouth/Throat: Mucous membranes are moist.  Cardiovascular: Normal rate, regular rhythm. Grossly normal heart sounds.  2+ radial pulses bilaterally. Respiratory: Normal respiratory effort.  No  retractions. Lungs CTAB.  No chest wall tenderness to palpation. Gastrointestinal: Soft and nontender. No distention. Musculoskeletal: Diffuse tenderness to palpation of left shoulder with associated ecchymosis.  Large skin tear to left upper arm with wrap in place.  No lower extremity tenderness nor edema.  Neurologic:  Normal speech and language. No gross focal neurologic deficits are appreciated.    ED Results / Procedures / Treatments   Labs (all labs ordered are listed, but only abnormal results are displayed) Labs Reviewed  CBC WITH DIFFERENTIAL/PLATELET - Abnormal; Notable for the following components:      Result Value   RBC 3.61 (*)    Hemoglobin 11.1 (*)    HCT 34.6 (*)    Neutro Abs 8.7 (*)    All other components within normal limits  BASIC METABOLIC PANEL WITH GFR - Abnormal; Notable for the following components:   Glucose, Bld 119 (*)    BUN 24 (*)    Creatinine, Ser 1.59 (*)    GFR, Estimated 29 (*)    All other components within normal limits     EKG  ED ECG REPORT I, Carlin Palin, the attending physician,  personally viewed and interpreted this ECG.   Date: 05/30/2024  EKG Time: 17:38  Rate: 65  Rhythm: normal sinus rhythm  Axis: LAD  Intervals:right bundle branch block and left anterior fascicular block  ST&T Change: None  RADIOLOGY Left shoulder x-ray reviewed and interpreted by me with proximal humerus fracture, no dislocation noted.  PROCEDURES:  Critical Care performed: No  Procedures   MEDICATIONS ORDERED IN ED: Medications  oxyCODONE -acetaminophen  (PERCOCET/ROXICET) 5-325 MG per tablet 1 tablet (1 tablet Oral Given 05/30/24 1728)     IMPRESSION / MDM / ASSESSMENT AND PLAN / ED COURSE  I reviewed the triage vital signs and the nursing notes.                              89 y.o. female with past medical history of hypertension, hyperlipidemia, and hypothyroidism who presents to the ED complaining of fall earlier today onto her left  shoulder.  Patient's presentation is most consistent with acute presentation with potential threat to life or bodily function.  Differential diagnosis includes, but is not limited to, fracture, dislocation, intracranial injury, cervical spine injury.  Patient nontoxic-appearing and in no acute distress, vital signs are unremarkable.  She has ecchymosis and tenderness to her left shoulder, is neurovascularly intact distally.  Skin tear has previously been dressed with Steri-Strips and Ace wrap.  X-rays show fracture of the left proximal humerus, no fracture noted to the left elbow.  Patient was offered imaging of her head and cervical spine, but denies headache or neck pain and sister at bedside, who is her medical power of attorney, declines CT imaging.  This is reasonable given no midline cervical spine tenderness and no neurologic deficits.  Patient has limited mobility in her right arm due to prior injury, currently lives alone and family is concerned about her ability to care for herself.  Labs with mild AKI but no significant anemia, leukocytosis, or electrolyte abnormality.  EKG without evidence of arrhythmia or ischemia.  Case discussed with hospitalist for admission.      FINAL CLINICAL IMPRESSION(S) / ED DIAGNOSES   Final diagnoses:  Fall, initial encounter  Closed fracture of proximal end of left humerus, unspecified fracture morphology, initial encounter     Rx / DC Orders   ED Discharge Orders     None        Note:  This document was prepared using Dragon voice recognition software and may include unintentional dictation errors.   Willo Dunnings, MD 05/30/24 [phone]

## 2024-05-31 ENCOUNTER — Other Ambulatory Visit: Payer: Self-pay

## 2024-05-31 DIAGNOSIS — F32A Depression, unspecified: Secondary | ICD-10-CM

## 2024-05-31 DIAGNOSIS — N1832 Chronic kidney disease, stage 3b: Secondary | ICD-10-CM | POA: Diagnosis not present

## 2024-05-31 DIAGNOSIS — S42292A Other displaced fracture of upper end of left humerus, initial encounter for closed fracture: Secondary | ICD-10-CM | POA: Diagnosis not present

## 2024-05-31 DIAGNOSIS — W19XXXA Unspecified fall, initial encounter: Secondary | ICD-10-CM | POA: Diagnosis not present

## 2024-05-31 LAB — CBC
HCT: 30 % — ABNORMAL LOW (ref 36.0–46.0)
Hemoglobin: 9.8 g/dL — ABNORMAL LOW (ref 12.0–15.0)
MCH: 31.3 pg (ref 26.0–34.0)
MCHC: 32.7 g/dL (ref 30.0–36.0)
MCV: 95.8 fL (ref 80.0–100.0)
Platelets: 158 K/uL (ref 150–400)
RBC: 3.13 MIL/uL — ABNORMAL LOW (ref 3.87–5.11)
RDW: 13.1 % (ref 11.5–15.5)
WBC: 7.5 K/uL (ref 4.0–10.5)
nRBC: 0 % (ref 0.0–0.2)

## 2024-05-31 LAB — COMPREHENSIVE METABOLIC PANEL WITH GFR
ALT: 11 U/L (ref 0–44)
AST: 17 U/L (ref 15–41)
Albumin: 3.3 g/dL — ABNORMAL LOW (ref 3.5–5.0)
Alkaline Phosphatase: 44 U/L (ref 38–126)
Anion gap: 4 — ABNORMAL LOW (ref 5–15)
BUN: 24 mg/dL — ABNORMAL HIGH (ref 8–23)
CO2: 26 mmol/L (ref 22–32)
Calcium: 9.2 mg/dL (ref 8.9–10.3)
Chloride: 106 mmol/L (ref 98–111)
Creatinine, Ser: 1.4 mg/dL — ABNORMAL HIGH (ref 0.44–1.00)
GFR, Estimated: 34 mL/min — ABNORMAL LOW (ref >60)
Glucose, Bld: 113 mg/dL — ABNORMAL HIGH (ref 70–99)
Potassium: 4.3 mmol/L (ref 3.5–5.1)
Sodium: 136 mmol/L (ref 135–145)
Total Bilirubin: 0.8 mg/dL (ref 0.0–1.2)
Total Protein: 6 g/dL — ABNORMAL LOW (ref 6.5–8.1)

## 2024-05-31 MED ORDER — MAGNESIUM 200 MG PO TABS: ORAL_TABLET | Freq: Every day | ORAL | Status: DC

## 2024-05-31 MED ORDER — MAGNESIUM OXIDE -MG SUPPLEMENT 400 (240 MG) MG PO TABS
200.0000 mg | ORAL_TABLET | Freq: Every day | ORAL | Status: DC
Start: 2024-05-31 — End: 2024-06-04
  Administered 2024-05-31 – 2024-06-04 (×5): 200 mg via ORAL
  Filled 2024-05-31 (×5): qty 1

## 2024-05-31 MED ORDER — OXYCODONE-ACETAMINOPHEN 5-325 MG PO TABS
1.0000 | ORAL_TABLET | ORAL | Status: DC | PRN
  Administered 2024-05-31 – 2024-06-02 (×6): 1 via ORAL
  Filled 2024-05-31 (×6): qty 1

## 2024-05-31 MED ORDER — SERTRALINE HCL 50 MG PO TABS
25.0000 mg | ORAL_TABLET | Freq: Every day | ORAL | Status: DC
Start: 2024-05-31 — End: 2024-06-04
  Administered 2024-05-31 – 2024-06-04 (×5): 25 mg via ORAL
  Filled 2024-05-31 (×5): qty 1

## 2024-05-31 NOTE — Progress Notes (Addendum)
 Patient settled in room 139B, bed locked to lowest position, needs, phone and call bell within reach. HOB to 30 degree angle d/t infusion of D5LR at 40 mL/hr. Will continue to monitor and assess per plan of care.

## 2024-05-31 NOTE — TOC Initial Note (Signed)
 Transition of Care [facility]) - Initial/Assessment Note    Patient Details  Name: Felicia Weaver MRN: [id number] Date of Birth: [birth year]  [facility]) CM/SW Contact:    Alvaro Louder, LCSW Phone Number: 05/31/2024, 1:44 PM  Clinical Narrative:    Per Chart review patient from Home PCP is Charlie Forte. LCSWA Faxed out information to SNF's in [place]. LCSWA will present Facilities to patient and POA at the bedside.  Family prefers SNF [facility]     TOC to follow for discharge.        Patient Goals and CMS Choice            Expected Discharge Plan and Services                                              Prior Living Arrangements/Services                       Activities of Daily Living   ADL Screening (condition at time of admission) Independently performs ADLs?: Yes (appropriate for developmental age) (Yet, at night she has an aid that stays with her, does not cook for herself, uses a walker during the day and sits on recliner. The Right shoulder/collar bone was fractured 5yrs ago and has limited mobility to RUE.) Is the patient deaf or have difficulty hearing?: No Does the patient have difficulty seeing, even when wearing glasses/contacts?: Yes (Completely bling to Left eye) Does the patient have difficulty concentrating, remembering, or making decisions?: Yes  Permission Sought/Granted                  Emotional Assessment              Admission diagnosis:  Fall [W19.XXXA] Fall, initial encounter [id number]] Closed fracture of proximal end of left humerus, unspecified fracture morphology, initial encounter [S42.202A] Patient Active Problem List   Diagnosis Date Noted   Fall 05/30/2024   AKI (acute kidney injury) (HCC) 05/30/2024   Closed fracture of upper end of humerus 05/30/2024   Hyperlipidemia 05/30/2024   Stage 3 chronic kidney disease (HCC) 07/30/2023   Impingement syndrome of shoulder region 04/16/2019    Mastalgia in female 01/31/2016   Adult hypothyroidism 07/17/2015   Dermatitis, eczematoid 07/17/2015   Essential hypertension 07/17/2015   Blood glucose elevated 07/17/2015   Combined fat and carbohydrate induced hyperlipemia 07/17/2015   Osteopenia 07/17/2015   Breast mass 03/08/2014   Breast calcifications 10/10/2013   Flat epithelial atypia of breast 08/17/2013   Atheroma, skin 05/23/2009   Glaucoma 05/03/2008   PCP:  Forte Charlie CROME, MD Pharmacy:   [facility] - [place], [place] - [address] [place] [address] Phone: [phone] Fax: [phone]     Social Drivers of Health (SDOH) Social History: SDOH Screenings   Food Insecurity: No Food Insecurity (05/31/2024)  Housing: Low Risk  (05/31/2024)  Transportation Needs: No Transportation Needs (05/31/2024)  Utilities: Not At Risk (05/31/2024)  Alcohol Screen: Low Risk  (03/03/2020)  Depression (PHQ2-9): Low Risk  (03/03/2020)  Financial Resource Strain: Low Risk  (11/15/2023)   Received from [facility]  Physical Activity: Inactive (03/03/2020)  Social Connections: Moderately Isolated (05/31/2024)  Stress: No Stress Concern Present (03/03/2020)  Tobacco Use: Low Risk  (05/30/2024)   SDOH Interventions:  Readmission Risk Interventions     No data to display

## 2024-05-31 NOTE — Assessment & Plan Note (Signed)
 Patient was on Lasix and losartan at home. Blood pressure currently within goal. - Holding home antihypertensives-we will restart once needed

## 2024-05-31 NOTE — Assessment & Plan Note (Signed)
 Mechanical fall resulted in left femoral fracture and large laceration of left arm requiring suturing. - PT is recommending SNF -See consult for placement

## 2024-05-31 NOTE — Progress Notes (Signed)
 Patient arrived from ED and awaiting in hallway in 1A as family member was not informed that patient was assigned a shared room. At this time, negotiating moving beds to provide patient with single room. Patient post mechanical fall at assisted living with confirmed Left proximal humeral head fracture. Patient Alert and Oriented x 3 to 4. Vital signs taken in hallway and stable. No complaints of pain at this time. Sling in place to LUE. Granddaughter at bedside. Will continue to monitor.

## 2024-05-31 NOTE — Progress Notes (Signed)
 Progress Note   Patient: Felicia Weaver FMW:[id number] DOB: 12-11-1924 DOA: 05/30/2024     1 DOS: the patient was seen and examined on 05/31/2024   Brief hospital course: Partly taken from H&P.  Felicia Weaver is a 89 y.o. female with medical history significant of essential hypertension, hyperlipidemia, squamous cell carcinoma of the skin, glaucoma, history of right shoulder injury with limitation of range of motion who presented to the ER with mechanical fall today at home falling onto her left side and resulted in comminuted, displaced and impacted proximal humeral head fracture.  Patient went to see her orthopedics at [facility] today where she was evaluated.  She is not a surgical candidate.  She was sent over to the ER for further evaluation and possible admission and placement.  She is unable to use both arms.    On presentation vitals and labs seem stable around her baseline.  Imaging as mentioned above for humeral fracture, no bony lesions involving left forearm.  Patient was admitted for pain control, PT and OT evaluation ordered.  9/18: Vital stable, slight decrease in hemoglobin to 9.8, renal function within baseline of 1.3-1.5. PT is recommending SNF. Patient currently full code-palliative care was consulted to discuss goals of care.  Assessment and Plan: * Fall Mechanical fall resulted in left femoral fracture and large laceration of left arm requiring suturing. - PT is recommending SNF -See consult for placement  Closed fracture of upper end of humerus Imaging with concern of communicated, displaced proximal femoral fracture. Was evaluated by orthopedic surgery and they are recommending conservative management.  Patient will remain in sling and follow-up with outpatient orthopedic surgery. - Continue with PT and OT -Continue with supportive care and pain management  Essential hypertension Patient was on Lasix and losartan at home. Blood pressure currently within goal. -  Holding home antihypertensives-we will restart once needed  Stage 3 chronic kidney disease (HCC) Creatinine around baseline, seems like underlying CKD stage IIIb with baseline around 1.3-1.6.  Did not meet criteria for AKI-AKI ruled out - Continue to monitor -Avoid nephrotoxins  Depression - Continue home Zoloft    Subjective: Patient was sitting comfortably in chair when seen today.  Denies any significant pain unless she is made some movements at left shoulder.  Limited mobility on right due to prior right femoral fracture many years ago.  Physical Exam: Vitals:   05/31/24 0120 05/31/24 0419 05/31/24 0811 05/31/24 1607  BP: (!) 127/55 (!) 107/52 (!) 114/49 (!) 118/49  Pulse: 62 66 65 61  Resp: 18 16 16 16   Temp: 98.9 F (37.2 C) 98.5 F (36.9 C) 99.4 F (37.4 C) 98.8 F (37.1 C)  TempSrc: Oral  Oral Oral  SpO2: 97% 93% 96% 97%  Weight:       General.  Frail and malnourished elderly lady, in no acute distress. Pulmonary.  Lungs clear bilaterally, normal respiratory effort. CV.  Regular rate and rhythm, no JVD, rub or murmur. Abdomen.  Soft, nontender, nondistended, BS positive. CNS.  Alert and oriented .  No focal neurologic deficit. Extremities.  No edema,  pulses intact and symmetrical.  Left upper extremity with sling.  Data Reviewed: Prior data reviewed  Family Communication: Discussed with daughters at bedside  Disposition: Status is: Inpatient Remains inpatient appropriate because: Severity of illness  Planned Discharge Destination: Skilled nursing facility  DVT prophylaxis.  Lovenox  Time spent: 45 minutes  This record has been created using Conservation officer, historic buildings. Errors have been sought and corrected,but  may not always be located. Such creation errors do not reflect on the standard of care.   Author: Amaryllis Dare, MD 05/31/2024 4:22 PM  For on call review [email or web address].

## 2024-05-31 NOTE — Plan of Care (Signed)

## 2024-05-31 NOTE — Assessment & Plan Note (Signed)
 Imaging with concern of communicated, displaced proximal femoral fracture. Was evaluated by orthopedic surgery and they are recommending conservative management.  Patient will remain in sling and follow-up with outpatient orthopedic surgery. - Continue with PT and OT -Continue with supportive care and pain management

## 2024-05-31 NOTE — Assessment & Plan Note (Signed)
-   Continue home Zoloft

## 2024-05-31 NOTE — NC FL2 (Signed)
 [place]  MEDICAID FL2 LEVEL OF CARE FORM     IDENTIFICATION  Patient Name: Felicia Weaver Birthdate: 1924/10/25 Sex: female Admission Date (Current Location): 05/30/2024  [facility] and [place] Number:  Chiropodist and Address:  [facility], [address], [place], [place] [address]      Provider Number: [id number]  Attending Physician Name and Address:  Caleen Qualia, MD  Relative Name and Phone Number:       Current Level of Care: SNF Recommended Level of Care: Skilled Nursing Facility Prior Approval Number:    Date Approved/Denied:   PASRR Number: [id number]  Discharge Plan: SNF    Current Diagnoses: Patient Active Problem List   Diagnosis Date Noted   Fall 05/30/2024   AKI (acute kidney injury) (HCC) 05/30/2024   Closed fracture of upper end of humerus 05/30/2024   Hyperlipidemia 05/30/2024   Stage 3 chronic kidney disease (HCC) 07/30/2023   Impingement syndrome of shoulder region 04/16/2019   Mastalgia in female 01/31/2016   Adult hypothyroidism 07/17/2015   Dermatitis, eczematoid 07/17/2015   Essential hypertension 07/17/2015   Blood glucose elevated 07/17/2015   Combined fat and carbohydrate induced hyperlipemia 07/17/2015   Osteopenia 07/17/2015   Breast mass 03/08/2014   Breast calcifications 10/10/2013   Flat epithelial atypia of breast 08/17/2013   Atheroma, skin 05/23/2009   Glaucoma 05/03/2008    Orientation RESPIRATION BLADDER Height & Weight     Self, Situation, Place  Normal Continent Weight: 143 lb 4.8 oz (65 kg) Height:     BEHAVIORAL SYMPTOMS/MOOD NEUROLOGICAL BOWEL NUTRITION STATUS      Continent Diet  AMBULATORY STATUS COMMUNICATION OF NEEDS Skin   Limited Assist Verbally Normal                       Personal Care Assistance Level of Assistance  Bathing, Feeding, Dressing Bathing Assistance: Limited assistance Feeding assistance: Independent Dressing Assistance: Limited assistance      Functional Limitations Info  Sight, Hearing, Speech Sight Info: Impaired (Blind left eye) Hearing Info: Adequate Speech Info: Adequate    SPECIAL CARE FACTORS FREQUENCY  PT (By licensed PT), OT (By licensed OT)     PT Frequency: 5x/week OT Frequency: 5x/week            Contractures      Additional Factors Info  Code Status, Allergies Code Status Info: Full Allergies Info: Betagan  (Levobunolol Hcl), Brimonidine Tartrate, Other, Sulfa Antibiotics           Current Medications (05/31/2024):  This is the current hospital active medication list Current Facility-Administered Medications  Medication Dose Route Frequency Provider Last Rate Last Admin   acetaminophen  (TYLENOL ) tablet 650 mg  650 mg Oral Q6H PRN Garba, Mohammad L, MD   650 mg at 05/31/24 0645   Or   acetaminophen  (TYLENOL ) suppository 650 mg  650 mg Rectal Q6H PRN Sim Emery CROME, MD       dextrose  5 % in lactated ringers  infusion   Intravenous Continuous Sim Emery CROME, MD   Stopped at 05/31/24 1349   enoxaparin  (LOVENOX ) injection 30 mg  30 mg Subcutaneous QHS Sim Emery L, MD   30 mg at 05/30/24 2123   magnesium  oxide (MAG-OX) tablet 200 mg  200 mg Oral Daily Lenon Elsie HERO, RPH   200 mg at 05/31/24 9057   morphine  (PF) 2 MG/ML injection 2 mg  2 mg Intravenous Q2H PRN Garba, Mohammad L, MD   2 mg  at 05/31/24 0014   ondansetron  (ZOFRAN ) tablet 4 mg  4 mg Oral Q6H PRN Sim Emery CROME, MD       Or   ondansetron  (ZOFRAN ) injection 4 mg  4 mg Intravenous Q6H PRN Garba, Mohammad L, MD       oxyCODONE -acetaminophen  (PERCOCET/ROXICET) 5-325 MG per tablet 1 tablet  1 tablet Oral Q4H PRN Amin, Sumayya, MD   1 tablet at 05/31/24 9057   sertraline  (ZOLOFT ) tablet 25 mg  25 mg Oral Daily Amin, Sumayya, MD   25 mg at 05/31/24 9057     Discharge Medications: Please see discharge summary for a list of discharge medications.  Relevant Imaging Results:  Relevant Lab Results:   Additional  Information SSN: [id number]  Alvaro Louder, LCSW

## 2024-05-31 NOTE — Assessment & Plan Note (Addendum)
 Creatinine around baseline, seems like underlying CKD stage IIIb with baseline around 1.3-1.6.  Did not meet criteria for AKI-AKI ruled out - Continue to monitor -Avoid nephrotoxins

## 2024-05-31 NOTE — Evaluation (Signed)
 Physical Therapy Evaluation Patient Details Name: Felicia Weaver MRN: [id number] DOB: [birth year] Today's Date: 05/31/2024  History of Present Illness  Pt is a 89 y.o. female with PMHx significant of essential HTN, HLD, squamous cell carcinoma of the skin, glaucoma, history of right shoulder injury with limitation of range of motion who presented to the ER with mechanical fall at home falling onto her left side and having significant proximal humeral head fracture.  Patient went to see her orthopedics at [facility] and is not a surgical candidate.  She was sent to the ER for further evaluation and possible admission and placement.   Clinical Impression  Pt was pleasant and motivated to participate during the session and put forth good effort throughout. No formal WB orders noted in the chart but given description of the patient's injury the LUE will be treated as NWB, sling found donned and adjusted by OT during co-eval.  Pt presents with a fall history that includes 4 falls since Feb of this year including fall related to this admission and typically ambulates with a RW.  Pt was able to amb with some effort and min A for stability with HHA this session but is now at an increased risk for falls compared to her baseline.  Pt will benefit from continued PT services upon discharge to safely address deficits listed in patient problem list for decreased caregiver assistance and eventual return to PLOF.          If plan is discharge home, recommend the following: A little help with walking and/or transfers;A little help with bathing/dressing/bathroom;Assistance with cooking/housework;Assistance with feeding;Direct supervision/assist for medications management;Supervision due to cognitive status;Assist for transportation;Help with stairs or ramp for entrance   Can travel by private vehicle   No    Equipment Recommendations Other (comment) (TBD at next venue of care)  Recommendations for Other  Services       Functional Status Assessment Patient has had a recent decline in their functional status and/or demonstrates limited ability to make significant improvements in function in a reasonable and predictable amount of time     Precautions / Restrictions Precautions Precautions: Fall Required Braces or Orthoses: Sling Restrictions Weight Bearing Restrictions Per Provider Order:  (Treating LUE as NWB) Other Position/Activity Restrictions: Given proximal humeral fracture assuming NWBing for LUE in sling, family denies knowledge of WBing or ROM restrictions from outpatient evaluation prior to ED      Mobility  Bed Mobility Overal bed mobility: Needs Assistance Bed Mobility: Supine to Sit     Supine to sit: Mod assist, +2 for physical assistance, HOB elevated     General bed mobility comments: Physical assist required for BLE and trunk management    Transfers Overall transfer level: Needs assistance Equipment used: 1 person hand held assist Transfers: Sit to/from Stand Sit to Stand: Min assist, +2 physical assistance, +2 safety/equipment           General transfer comment: Min A required to come to standing and then for stabiltiy upon initial stand to prevent posterior LOB    Ambulation/Gait Ambulation/Gait assistance: Min assist, +2 safety/equipment Gait Distance (Feet): 10 Feet Assistive device: 1 person hand held assist Gait Pattern/deviations: Step-through pattern, Decreased step length - right, Decreased step length - left, Shuffle, Trunk flexed Gait velocity: decreased     General Gait Details: Slow cadence with short sometimes shuffling steps with occasional min A for stability  Stairs            Wheelchair  Mobility     Tilt Bed    Modified Rankin (Stroke Patients Only)       Balance Overall balance assessment: Needs assistance Sitting-balance support: No upper extremity supported, Feet supported, Single extremity supported Sitting  balance-Leahy Scale: Fair     Standing balance support: Single extremity supported Standing balance-Leahy Scale: Poor Standing balance comment: Occasional Min A required for stability while ambulating                             Pertinent Vitals/Pain Pain Assessment Pain Assessment: No/denies pain Pain Location: L shoulder, No pain at rest, min to mod pain during mobility tasks Pain Descriptors / Indicators: Grimacing, Guarding Pain Intervention(s): Premedicated before session, Monitored during session, Repositioned    Home Living Family/patient expects to be discharged to:: Private residence Living Arrangements: Alone Available Help at Discharge: Family;Personal care attendant;Available PRN/intermittently Type of Home: Apartment Home Access: Level entry       Home Layout: One level Home Equipment: Agricultural consultant (2 wheels);Toilet riser;Grab bars - toilet Additional Comments: [facility], independent living    Prior Function Prior Level of Function : History of Falls (last six months);Needs assist             Mobility Comments: Pt ambulates with RW with SBA for longer facility walks, 4 total falls in the last 6-7 months ADLs Comments: Since Feb falls, family had ladies come stay with her overnight, intermittent in/out during the day but would help pt get to her lunch room (1 meal provided at community) before leaving, assist for providing other 2 meals and fix her beverages/snacks, assist wiht medication, assist for dressing and bathing (bird bath)     Extremity/Trunk Assessment   Upper Extremity Assessment Upper Extremity Assessment: Defer to OT evaluation RUE Deficits / Details: hx shoulder deficits limiting functional ROM RUE Coordination: decreased gross motor LUE Deficits / Details: hx injury to L hand from childhood limiting grip/FMC (was able to grasp RW but not much else) LUE: Unable to fully assess due to pain;Unable to fully  assess due to immobilization;Shoulder pain with ROM;Shoulder pain at rest LUE Coordination: decreased fine motor;decreased gross motor    Lower Extremity Assessment Lower Extremity Assessment: Generalized weakness       Communication   Communication Communication: Impaired Factors Affecting Communication: Hearing impaired    Cognition Arousal: Alert Behavior During Therapy: WFL for tasks assessed/performed   PT - Cognitive impairments: History of cognitive impairments                       PT - Cognition Comments: Pt alert primarily to self only but with some cuing could remember what brought her to the hospital Following commands: Intact       Cueing Cueing Techniques: Verbal cues, Tactile cues, Visual cues     General Comments General comments (skin integrity, edema, etc.): LUE sling repositioned for optimal positioning, safety, and comfort    Exercises     Assessment/Plan    PT Assessment Patient needs continued PT services  PT Problem List Decreased strength;Decreased activity tolerance;Decreased balance;Decreased mobility;Decreased knowledge of use of DME;Decreased knowledge of precautions;Pain       PT Treatment Interventions DME instruction;Gait training;Functional mobility training;Therapeutic activities;Therapeutic exercise;Balance training;Patient/family education    PT Goals (Current goals can be found in the Care Plan section)  Acute Rehab PT Goals Patient Stated Goal: To get stronger and walk better PT Goal  Formulation: With patient/family Time For Goal Achievement: 06/13/24 Potential to Achieve Goals: Fair    Frequency Min 2X/week     Co-evaluation PT/OT/SLP Co-Evaluation/Treatment: Yes Reason for Co-Treatment: For patient/therapist safety;To address functional/ADL transfers;Complexity of the patient's impairments (multi-system involvement) PT goals addressed during session: Mobility/safety with mobility OT goals addressed during  session: ADL's and self-care       AM-PAC PT 6 Clicks Mobility  Outcome Measure Help needed turning from your back to your side while in a flat bed without using bedrails?: A Lot Help needed moving from lying on your back to sitting on the side of a flat bed without using bedrails?: A Lot Help needed moving to and from a bed to a chair (including a wheelchair)?: A Lot Help needed standing up from a chair using your arms (e.g., wheelchair or bedside chair)?: A Lot Help needed to walk in hospital room?: A Little Help needed climbing 3-5 steps with a railing? : A Lot 6 Click Score: 13    End of Session Equipment Utilized During Treatment: Gait belt Activity Tolerance: Patient tolerated treatment well Patient left: in chair;with call bell/phone within reach;with chair alarm set;with family/visitor present Nurse Communication: Mobility status PT Visit Diagnosis: Unsteadiness on feet (R26.81);History of falling (Z91.81);Muscle weakness (generalized) (M62.81);Difficulty in walking, not elsewhere classified (R26.2);Pain Pain - Right/Left: Left Pain - part of body: Shoulder    Time: 9147-9062 PT Time Calculation (min) (ACUTE ONLY): 45 min   Charges:   PT Evaluation $PT Eval Moderate Complexity: 1 Mod PT Treatments $Therapeutic Activity: 8-22 mins PT General Charges $$ ACUTE PT VISIT: 1 Visit    D. Scott Joi Leyva PT, DPT 05/31/24, 11:31 AM

## 2024-05-31 NOTE — Hospital Course (Addendum)
 Partly taken from H&P.  Felicia Weaver is a 89 y.o. female with medical history significant of essential hypertension, hyperlipidemia, squamous cell carcinoma of the skin, glaucoma, history of right shoulder injury with limitation of range of motion who presented to the ER with mechanical fall today at home falling onto her left side and resulted in comminuted, displaced and impacted proximal humeral head fracture.  Patient went to see her orthopedics at [facility] today where she was evaluated.  She is not a surgical candidate.  She was sent over to the ER for further evaluation and possible admission and placement.  She is unable to use both arms.    On presentation vitals and labs seem stable around her baseline.  Imaging as mentioned above for humeral fracture, no bony lesions involving left forearm.  Patient was admitted for pain control, PT and OT evaluation ordered.  9/18: Vital stable, slight decrease in hemoglobin to 9.8, renal function within baseline of 1.3-1.5. PT is recommending SNF. Patient currently full code-palliative care was consulted to discuss goals of care.  9/19: Remained hemodynamically stable, CODE STATUS changed to DNR.  CT head ordered due to daughter's concern of any underlying stroke as patient was complaining of decreased in vision. Pending insurance authorization for [employer].  9/20: Remained stable, awaiting insurance authorization.  CT head was negative for any acute abnormality.  9/21: Hemodynamically stable, little more somnolent this morning but ate breakfast.  Obtain insurance authorization to go to rehab tomorrow.  9/22: Patient remained hemodynamically stable, blood pressure now started trending up so restarting home losartan .  She will resume her 3 times weekly Lasix and home medications on discharge.  Patient does not need any opioids for pain control so they were not prescribed and family also does not want any sedating medications.  Her  physical therapist recommended SNF where she is being discharged for rehab.  She will continue on current medications and need to have a close follow-up with her providers for further assistance.

## 2024-05-31 NOTE — Evaluation (Signed)
 Occupational Therapy Evaluation Patient Details Name: Felicia Weaver MRN: [id number] DOB: 02-18-1925 Today's Date: 05/31/2024   History of Present Illness   89 y.o. female with PMHx significant of essential HTN, HLD, squamous cell carcinoma of the skin, glaucoma, history of right shoulder injury with limitation of range of motion who presented to the ER with mechanical fall today at home falling onto her left side and having significant proximal humeral head fracture.  Patient went to see her orthopedics at [facility] and is not a surgical candidate.  She was sent to the ER for further evaluation and possible admission and placement.   Clinical Impressions Pt was seen for OT evaluation and co-tx with PT this date. Prior to hospital admission, pt was living alone in an independent living apartment at [facility]. Family present and report that since February when pt had a few falls, they have arranged for caregivers to be present overnight with the pt, they assist with med mgt, meal prep, and assist pt with bathing/dressing prior to going to the dining hall for lunch (the one meal provided by the facility). Pt alert, pleasant, and agreeable. Pt presents with deficits in BUE strength, coordination, and ROM, LUE pain with arm in sling, decreased balance, safety, and activity tolerance, with hx of impaired L hand function since childhood, hx decreased vision in B eyes, and hx of R shoulder ROM and functional use, all affecting safe and optimal ADL completion. Pt currently requires MOD A +2 for bed mobility, MIN A +2 for repeated STS transfers during LB dressing requiring MAX A, MAX A for seated UB dressing and sling mgt to optimize fit, positioning, and comfort, and MIN A +2 for safety for handheld assist mobility in the room. Pt instructed in sling positioning. Pt not at her baseline and requires increased assist for most aspects of ADL and mobility. Pt would benefit from skilled OT services  to address noted impairments and functional limitations (see below for any additional details) in order to maximize safety and independence while minimizing future risk of falls, injury, and readmission. Anticipate the need for follow up OT services upon acute hospital DC.    If plan is discharge home, recommend the following:   A lot of help with walking and/or transfers;A lot of help with bathing/dressing/bathroom;Assistance with cooking/housework;Assist for transportation;Direct supervision/assist for medications management;Supervision due to cognitive status;Help with stairs or ramp for entrance     Functional Status Assessment   Patient has had a recent decline in their functional status and demonstrates the ability to make significant improvements in function in a reasonable and predictable amount of time.     Equipment Recommendations   BSC/3in1     Recommendations for Other Services         Precautions/Restrictions   Precautions Precautions: Fall Recall of Precautions/Restrictions: Impaired Required Braces or Orthoses: Sling Restrictions Other Position/Activity Restrictions: Given proximal humeral fracture, assuming NWBing for LUE in sling, family denies knowledge of WBing or ROM restrictions from outpatient evaluation prior to ED     Mobility Bed Mobility Overal bed mobility: Needs Assistance Bed Mobility: Supine to Sit     Supine to sit: Mod assist, +2 for physical assistance, HOB elevated          Transfers Overall transfer level: Needs assistance Equipment used: 1 person hand held assist Transfers: Sit to/from Stand Sit to Stand: Min assist, +2 physical assistance  Balance Overall balance assessment: Needs assistance Sitting-balance support: No upper extremity supported, Feet supported, Single extremity supported Sitting balance-Leahy Scale: Fair     Standing balance support: Single extremity supported Standing  balance-Leahy Scale: Poor Standing balance comment: one instance of MIN A required to support balance while ambulating                           ADL either performed or assessed with clinical judgement   ADL Overall ADL's : Needs assistance/impaired Eating/Feeding: Sitting;Set up               Upper Body Dressing : Sitting;Maximal assistance   Lower Body Dressing: Sit to/from stand;Maximal assistance               Functional mobility during ADLs: Minimal assistance;+2 for safety/equipment       Vision Baseline Vision/History:  (Family reports pt blind in L eye and decreased peripheral vision in R eye) Patient Visual Report: No change from baseline       Perception         Praxis         Pertinent Vitals/Pain Pain Assessment Pain Assessment: Faces Faces Pain Scale: Hurts little more Pain Location: L shoulder Pain Descriptors / Indicators: Grimacing, Guarding Pain Intervention(s): Monitored during session, Repositioned     Extremity/Trunk Assessment Upper Extremity Assessment Upper Extremity Assessment: Generalized weakness;RUE deficits/detail;LUE deficits/detail;Right hand dominant RUE Deficits / Details: hx shoulder deficits limiting functional ROM RUE Coordination: decreased gross motor LUE Deficits / Details: hx injury to L hand from childhood limiting grip/FMC (was able to grasp RW but not much else) LUE: Unable to fully assess due to pain;Unable to fully assess due to immobilization;Shoulder pain with ROM;Shoulder pain at rest LUE Coordination: decreased fine motor;decreased gross motor   Lower Extremity Assessment Lower Extremity Assessment: Defer to PT evaluation;Generalized weakness       Communication Communication Communication: Impaired Factors Affecting Communication: Hearing impaired   Cognition Arousal: Alert Behavior During Therapy: WFL for tasks assessed/performed Cognition: Cognition impaired   Orientation impairments:  Time, Situation                                   Cueing  General Comments      LUE sling repositioned for optimal positioning, safety, and comfort   Exercises Other Exercises Other Exercises: Pt instructed in sling positioning   Shoulder Instructions      Home Living Family/patient expects to be discharged to:: Private residence     Type of Home: Apartment Home Access: Level entry (curb step with ramp available)     Home Layout: One level               Home Equipment: Agricultural consultant (2 wheels);Toilet riser;Grab bars - toilet   Additional Comments: [facility], independent living      Prior Functioning/Environment Prior Level of Function : History of Falls (last six months)             Mobility Comments: 2WW at all times, 3 falls in Feb and one leading to this admission ADLs Comments: Since Feb falls, family had ladies come stay with her overnight, intermittent in/out during the day but would help pt get to her lunch room (1 meal provided at community) before leaving, assist for providing other 2 meals and fix her beverages/snacks, assist wiht medication, assist for dressing  and bathing (bird bath)    OT Problem List: Decreased strength;Decreased coordination;Pain;Decreased range of motion;Decreased cognition;Decreased activity tolerance;Decreased safety awareness;Impaired balance (sitting and/or standing);Decreased knowledge of use of DME or AE;Impaired UE functional use;Decreased knowledge of precautions   OT Treatment/Interventions: Self-care/ADL training;Therapeutic exercise;Therapeutic activities;Cognitive remediation/compensation;Energy conservation;DME and/or AE instruction;Patient/family education;Balance training      OT Goals(Current goals can be found in the care plan section)   Acute Rehab OT Goals Patient Stated Goal: get better OT Goal Formulation: With patient/family Time For Goal Achievement:  06/14/24 Potential to Achieve Goals: Good ADL Goals Pt Will Transfer to Toilet: with supervision;ambulating (LRAD) Pt Will Perform Toileting - Clothing Manipulation and hygiene: with supervision;sitting/lateral leans;sit to/from stand Additional ADL Goal #1: Pt will independently instruct family/caregivers in sling mgt   OT Frequency:  Min 2X/week    Co-evaluation PT/OT/SLP Co-Evaluation/Treatment: Yes Reason for Co-Treatment: For patient/therapist safety;To address functional/ADL transfers;Complexity of the patient's impairments (multi-system involvement) PT goals addressed during session: Mobility/safety with mobility OT goals addressed during session: ADL's and self-care      AM-PAC OT 6 Clicks Daily Activity     Outcome Measure Help from another person eating meals?: None Help from another person taking care of personal grooming?: A Little Help from another person toileting, which includes using toliet, bedpan, or urinal?: A Lot Help from another person bathing (including washing, rinsing, drying)?: A Lot Help from another person to put on and taking off regular upper body clothing?: A Lot Help from another person to put on and taking off regular lower body clothing?: A Lot 6 Click Score: 15   End of Session Equipment Utilized During Treatment: Gait belt  Activity Tolerance: Patient tolerated treatment well Patient left: in chair;with call bell/phone within reach;with chair alarm set;with family/visitor present  OT Visit Diagnosis: Unsteadiness on feet (R26.81);Repeated falls (R29.6);Muscle weakness (generalized) (M62.81);Pain Pain - Right/Left: Left Pain - part of body: Shoulder                Time: 9143-9059 OT Time Calculation (min): 44 min Charges:  OT General Charges $OT Visit: 1 Visit OT Evaluation $OT Eval Moderate Complexity: 1 Mod OT Treatments $Self Care/Home Management : 8-22 mins  Warren SAUNDERS., MPH, MS, OTR/L ascom [phone] 05/31/24, 10:19 AM

## 2024-06-01 ENCOUNTER — Inpatient Hospital Stay

## 2024-06-01 DIAGNOSIS — Z515 Encounter for palliative care: Secondary | ICD-10-CM

## 2024-06-01 DIAGNOSIS — F32A Depression, unspecified: Secondary | ICD-10-CM | POA: Diagnosis not present

## 2024-06-01 DIAGNOSIS — Z789 Other specified health status: Secondary | ICD-10-CM | POA: Diagnosis not present

## 2024-06-01 DIAGNOSIS — Z7189 Other specified counseling: Secondary | ICD-10-CM | POA: Diagnosis not present

## 2024-06-01 DIAGNOSIS — W19XXXA Unspecified fall, initial encounter: Secondary | ICD-10-CM | POA: Diagnosis not present

## 2024-06-01 DIAGNOSIS — S42202A Unspecified fracture of upper end of left humerus, initial encounter for closed fracture: Secondary | ICD-10-CM

## 2024-06-01 DIAGNOSIS — N1832 Chronic kidney disease, stage 3b: Secondary | ICD-10-CM | POA: Diagnosis not present

## 2024-06-01 DIAGNOSIS — S42292A Other displaced fracture of upper end of left humerus, initial encounter for closed fracture: Secondary | ICD-10-CM | POA: Diagnosis not present

## 2024-06-01 MED ORDER — POLYETHYLENE GLYCOL 3350 17 G PO PACK
17.0000 g | PACK | Freq: Every day | ORAL | Status: DC
  Administered 2024-06-01 – 2024-06-04 (×4): 17 g via ORAL
  Filled 2024-06-01 (×4): qty 1

## 2024-06-01 NOTE — TOC Progression Note (Signed)
 Transition of Care [facility]) - Progression Note    Patient Details  Name: Felicia Weaver MRN: [id number] Date of Birth: [birth year]  Transition of Care [facility] CM/SW Contact  Alvaro Louder, [place] Phone Number: 06/01/2024, 3:41 PM  Clinical Narrative:   Insurance auth started with [employer] for patient to admit to [place].   TOC to follow for discharge                       Expected Discharge Plan and Services                                               Social Drivers of Health (SDOH) Interventions SDOH Screenings   Food Insecurity: No Food Insecurity (05/31/2024)  Housing: Low Risk  (05/31/2024)  Transportation Needs: No Transportation Needs (05/31/2024)  Utilities: Not At Risk (05/31/2024)  Alcohol Screen: Low Risk  (03/03/2020)  Depression (PHQ2-9): Low Risk  (03/03/2020)  Financial Resource Strain: Low Risk  (11/15/2023)   Received from [facility]  Physical Activity: Inactive (03/03/2020)  Social Connections: Moderately Isolated (05/31/2024)  Stress: No Stress Concern Present (03/03/2020)  Tobacco Use: Low Risk  (05/30/2024)    Readmission Risk Interventions     No data to display

## 2024-06-01 NOTE — Assessment & Plan Note (Signed)
 Patient was on Lasix and losartan at home. Blood pressure currently within goal. - Holding home antihypertensives-we will restart once needed

## 2024-06-01 NOTE — Care Management Important Message (Signed)
 Important Message  Patient Details  Name: Felicia Weaver MRN: [id number] Date of Birth: 07-21-1925   Important Message Given:  Yes - Medicare IM     Felicia Weaver 06/01/2024, 2:31 PM

## 2024-06-01 NOTE — Progress Notes (Signed)
 Progress Note   Patient: Felicia Weaver FMW:[id number] DOB: [birth year] DOA: 05/30/2024     2 DOS: the patient was seen and examined on 06/01/2024   Brief hospital course: Partly taken from H&P.  Felicia Weaver is a 89 y.o. female with medical history significant of essential hypertension, hyperlipidemia, squamous cell carcinoma of the skin, glaucoma, history of right shoulder injury with limitation of range of motion who presented to the ER with mechanical fall today at home falling onto her left side and resulted in comminuted, displaced and impacted proximal humeral head fracture.  Patient went to see her orthopedics at [facility] today where she was evaluated.  She is not a surgical candidate.  She was sent over to the ER for further evaluation and possible admission and placement.  She is unable to use both arms.    On presentation vitals and labs seem stable around her baseline.  Imaging as mentioned above for humeral fracture, no bony lesions involving left forearm.  Patient was admitted for pain control, PT and OT evaluation ordered.  9/18: Vital stable, slight decrease in hemoglobin to 9.8, renal function within baseline of 1.3-1.5. PT is recommending SNF. Patient currently full code-palliative care was consulted to discuss goals of care.  9/19: Remained hemodynamically stable, CODE STATUS changed to DNR.  CT head ordered due to daughter's concern of any underlying stroke as patient was complaining of decreased in vision. Pending insurance authorization for [facility].  Assessment and Plan: * Fall Mechanical fall resulted in left femoral fracture and large laceration of left arm requiring suturing. - PT is recommending SNF -See consult for placement  Closed fracture of upper end of humerus Imaging with concern of communicated, displaced proximal femoral fracture. Was evaluated by orthopedic surgery and they are recommending conservative management.  Patient will remain in  sling and follow-up with outpatient orthopedic surgery. - Continue with PT and OT -Continue with supportive care and pain management  Essential hypertension Patient was on Lasix and losartan at home. Blood pressure currently within goal. - Holding home antihypertensives-we will restart once needed  Stage 3 chronic kidney disease (HCC) Creatinine around baseline, seems like underlying CKD stage IIIb with baseline around 1.3-1.6.  Did not meet criteria for AKI-AKI ruled out - Continue to monitor -Avoid nephrotoxins  Depression - Continue home Zoloft    Subjective: Patient was resting comfortably when seen today.  Daughter at bedside and apparently patient was complaining of unable to see from her right eye earlier.  No current complaints.  Patient is blind from the left and had advanced glaucoma on right.  Daughter concern of any underlying stroke.  Physical Exam: Vitals:   05/31/24 2120 06/01/24 0344 06/01/24 0749 06/01/24 1544  BP: (!) 124/51 (!) 126/50 (!) 121/53 (!) 120/56  Pulse: 65 72 73 68  Resp: 16 16 17 14   Temp: 98.3 F (36.8 C) 98.9 F (37.2 C) 98.5 F (36.9 C) 98.5 F (36.9 C)  TempSrc: Oral Oral Oral Oral  SpO2: 95% 94% 93% 98%  Weight:       General.  Frail and malnourished elderly lady, in no acute distress. Pulmonary.  Lungs clear bilaterally, normal respiratory effort. CV.  Regular rate and rhythm, no JVD, rub or murmur. Abdomen.  Soft, nontender, nondistended, BS positive. CNS.  Alert and oriented .  No focal neurologic deficit. Extremities.  No edema, no cyanosis, pulses intact and symmetrical.  Data Reviewed: Prior data reviewed  Family Communication: Discussed with daughters at bedside  Disposition:  Status is: Inpatient Remains inpatient appropriate because: Severity of illness  Planned Discharge Destination: Skilled nursing facility  DVT prophylaxis.  Lovenox  Time spent: 44 minutes  This record has been created using Manufacturing engineer. Errors have been sought and corrected,but may not always be located. Such creation errors do not reflect on the standard of care.   Author: Amaryllis Dare, MD 06/01/2024 4:08 PM  For on call review [email or web address].

## 2024-06-01 NOTE — Plan of Care (Signed)

## 2024-06-01 NOTE — Consult Note (Signed)
 Consultation Note Date: 06/01/2024 at 1130  Patient Name: Felicia Weaver  DOB: 09-16-1924  MRN: [id number]  Age / Sex: 89 y.o., female  PCP: Bertrum Charlie CROME, MD Referring Physician: Caleen Qualia, MD  HPI/Patient Profile: 89 y.o. female  with past medical history significant for essential hypertension, HLD, squamous cell carcinoma of the skin, CKD stage III, glaucoma, history of right shoulder injury with limited range of motion. Patient presented to ED 05/30/2024 c/o from [facility] for further evaluation after fall resulting in comminuted, displaced and impacted left proximal humeral head fracture.  ED labs showed BUN 24, creatinine 1.59, GFR 29, RBC 3.61 and Hgb 11.1.  Labs stable and around patient's baseline.  ED vitals 151/56, HR 65, RR 18, SpO2 98% RA and 98 F  Orthopedic surgery evaluated and recommended conservative management with wearing sling and following up with outpatient orthopedic surgery.  TRH was consulted for admission and management of pain control for closed fracture of left proximal humeral head fracture.  Palliative medicine was consulted for assistance with goals of care conversations.  Clinical Assessment and Goals of Care: Extensive chart review completed prior to meeting patient including labs, vital signs, imaging, progress notes, orders, and available advanced directive documents from current and previous encounters. I then met with patient and Merlynn, daughter, to discuss diagnosis prognosis, GOC, EOL wishes, disposition and options.  I introduced Palliative Medicine as specialized medical care for people living with serious illness. It focuses on providing relief from the symptoms and stress of a serious illness. The goal is to improve quality of life for both the patient and the family.  Ill-appearing, elderly female sitting upright in recliner with daughter at bedside.  She  awakens to verbal stimuli but quickly falls back to sleep during visit.  She is alert and oriented to self, location and able to name her daughter at bedside.  She is not oriented to time or situation.  We discussed a brief life review of the patient.  Merlynn shares that patient is a widow after being married for 48 years.  She has 2 children, Merlynn and her brother.  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  After her son suffered injury causing disability at a young age she remained home to take care of him and also took care of multiple family members at end-of-life.  As far as functional and nutritional status Merlynn states her mother lives in [facility] independent living with in-home caregivers that stay at night.  Prior to this admission she required assistance with ADLs but was able to use her walker to use the bathroom independently.  She required assistance with meals provided by [facility] and family.  Merlynn shares her mother has had decrease in appetite and overall decline over the past year.  She is greatly concerned that her mother is no longer able to feed herself due to new left shoulder fracture and limited ability to use right arm.  We discussed patient's current illness and what it  means in the larger context of patient's on-going co-morbidities.  Natural disease trajectory and expectations at EOL were discussed.  I attempted to elicit values and goals of care important to the patient.  Merlynn shares that her mother would not want to live with a poor quality of life.  She understands that her mother is in a difficult situation not being able to use her left arm but also not being able to use her right due to a previous injury in 2012 that left her right shoulder frozen.  Merlynn advises that her mother is a DNR and has paperwork at home stating this as she would never want CPR or ventilator support.  DNR signed, uploaded to epic and placed in chart.  The  difference between aggressive medical intervention and comfort care was considered in light of the patient's goals of care.  Merlynn understands that her mother is in a difficult position and may not be able to rehab due to previous injury to right shoulder and current injury with inability to use walker.  She is concerned that being bedbound and inability to feed herself will lead to further decline.  Advance directives, concepts specific to code status, artificial feeding and hydration, and rehospitalization were considered and discussed.  Patient is a DNR.  Family would never want feeding tube or other interventions to prolong life artificially.  Education offered regarding concept specific to human mortality and the limitations of medical interventions to prolong life when the body begins to fail to thrive.  Family is facing treatment option decisions, advanced directive, and anticipatory care needs.    Discussed with patient/family the importance of continued conversation with family and the medical providers regarding overall plan of care and treatment options, ensuring decisions are within the context of the patient's values and GOCs.    Hospice and Palliative Care services outpatient were explained and offered.  Current plan is for patient to discharge to [facility] for rehab.  Advised daughter to inquire about palliative care at the facility.  We also discussed if her mother does not respond well to rehabilitation, hospice would be an option for end-of-life care.  Questions and concerns were addressed. The family was encouraged to call with questions or concerns.   Primary Decision Maker HCPOA-Joyce Ginnie, daughter  Physical Exam Vitals and nursing note reviewed.  Constitutional:      General: She is not in acute distress.    Appearance: She is ill-appearing.  HENT:     Head: Normocephalic and atraumatic.     Mouth/Throat:     Mouth: Mucous membranes are dry.  Eyes:      Comments: Blindness to left eye  Pulmonary:     Effort: Pulmonary effort is normal. No respiratory distress.  Musculoskeletal:     Right lower leg: No edema.     Left lower leg: No edema.  Skin:    General: Skin is warm and dry.  Neurological:     Mental Status: She is alert. She is disoriented.     Motor: Weakness present.  Psychiatric:        Mood and Affect: Mood normal.        Behavior: Behavior normal.   Recommendations/Plan: DNR/DNI- Uploaded to Epic via Ep[employer]io, signed DNR placed in paper chart Continue current supportive interventions Plan to d/c to SNF for rehab Outpatient palliative to follow  Palliative Assessment/Data: 30%   Discussed plan of care with TOC, Dr. Caleen and primary RN  Thank you for this consult.  Palliative medicine will continue to follow and assist holistically.   Time Total: 90 minutes  Time spent includes: Detailed review of medical records (labs, imaging, vital signs), medically appropriate exam (mental status, respiratory, cardiac, skin), discussed with treatment team, counseling and educating patient, family and staff, documenting clinical information, medication management and coordination of care.     Devere Sacks, ELNITA- [facility] Palliative Medicine Team  06/01/2024 11:06 AM  Office [phone]  Pager [phone]     Please contact Palliative Medicine Team providers via AMION for questions and concerns.

## 2024-06-02 DIAGNOSIS — Z7189 Other specified counseling: Secondary | ICD-10-CM | POA: Diagnosis not present

## 2024-06-02 DIAGNOSIS — N1832 Chronic kidney disease, stage 3b: Secondary | ICD-10-CM | POA: Diagnosis not present

## 2024-06-02 DIAGNOSIS — W19XXXA Unspecified fall, initial encounter: Secondary | ICD-10-CM | POA: Diagnosis not present

## 2024-06-02 DIAGNOSIS — Z789 Other specified health status: Secondary | ICD-10-CM | POA: Diagnosis not present

## 2024-06-02 DIAGNOSIS — S42292A Other displaced fracture of upper end of left humerus, initial encounter for closed fracture: Secondary | ICD-10-CM | POA: Diagnosis not present

## 2024-06-02 DIAGNOSIS — Z515 Encounter for palliative care: Secondary | ICD-10-CM | POA: Diagnosis not present

## 2024-06-02 DIAGNOSIS — F32A Depression, unspecified: Secondary | ICD-10-CM | POA: Diagnosis not present

## 2024-06-02 MED ORDER — LACTULOSE 10 GM/15ML PO SOLN
30.0000 g | Freq: Two times a day (BID) | ORAL | Status: DC
Start: 2024-06-02 — End: 2024-06-04
  Administered 2024-06-02 – 2024-06-03 (×3): 30 g via ORAL
  Filled 2024-06-02 (×4): qty 60

## 2024-06-02 NOTE — Progress Notes (Signed)
 Progress Note   Patient: Felicia Weaver FMW:[id number] DOB: [birth year] DOA: 05/30/2024     3 DOS: the patient was seen and examined on 06/02/2024   Brief hospital course: Partly taken from H&P.  Felicia Weaver is a 89 y.o. female with medical history significant of essential hypertension, hyperlipidemia, squamous cell carcinoma of the skin, glaucoma, history of right shoulder injury with limitation of range of motion who presented to the ER with mechanical fall today at home falling onto her left side and resulted in comminuted, displaced and impacted proximal humeral head fracture.  Patient went to see her orthopedics at [facility] today where she was evaluated.  She is not a surgical candidate.  She was sent over to the ER for further evaluation and possible admission and placement.  She is unable to use both arms.    On presentation vitals and labs seem stable around her baseline.  Imaging as mentioned above for humeral fracture, no bony lesions involving left forearm.  Patient was admitted for pain control, PT and OT evaluation ordered.  9/18: Vital stable, slight decrease in hemoglobin to 9.8, renal function within baseline of 1.3-1.5. PT is recommending SNF. Patient currently full code-palliative care was consulted to discuss goals of care.  9/19: Remained hemodynamically stable, CODE STATUS changed to DNR.  CT head ordered due to daughter's concern of any underlying stroke as patient was complaining of decreased in vision. Pending insurance authorization for [facility].  9/20: Remained stable, awaiting insurance authorization.  CT head was negative for any acute abnormality.  Assessment and Plan: * Fall Mechanical fall resulted in left femoral fracture and large laceration of left arm requiring suturing. - PT is recommending SNF -See consult for placement  Closed fracture of upper end of humerus Imaging with concern of communicated, displaced proximal femoral fracture. Was  evaluated by orthopedic surgery and they are recommending conservative management.  Patient will remain in sling and follow-up with outpatient orthopedic surgery. - Continue with PT and OT -Continue with supportive care and pain management  Essential hypertension Patient was on Lasix and losartan  at home. Blood pressure currently within goal. - Holding home antihypertensives-we will restart once needed  Stage 3 chronic kidney disease (HCC) Creatinine around baseline, seems like underlying CKD stage IIIb with baseline around 1.3-1.6.  Did not meet criteria for AKI-AKI ruled out - Continue to monitor -Avoid nephrotoxins  Depression - Continue home Zoloft    Subjective: Patient was sitting comfortably in chair when seen today.  No new concern.  Daughter at bedside.  Physical Exam: Vitals:   06/01/24 2042 06/02/24 0346 06/02/24 0745 06/02/24 1628  BP: (!) 128/45 121/63 (!) 140/52 (!) 149/56  Pulse: 75 77 76 70  Resp: 14 14 16 14   Temp: 98.2 F (36.8 C) 98 F (36.7 C) 98.7 F (37.1 C) 97.6 F (36.4 C)  TempSrc: Oral Oral Oral   SpO2: 97% 95% 94% 96%  Weight:       General.  Frail elderly lady, in no acute distress. Pulmonary.  Lungs clear bilaterally, normal respiratory effort. CV.  Regular rate and rhythm, no JVD, rub or murmur. Abdomen.  Soft, nontender, nondistended, BS positive. CNS.  Alert and oriented .  No focal neurologic deficit. Extremities.  No edema, no cyanosis, pulses intact and symmetrical.  Data Reviewed: Prior data reviewed  Family Communication: Discussed with daughters at bedside  Disposition: Status is: Inpatient Remains inpatient appropriate because: Severity of illness  Planned Discharge Destination: Skilled nursing facility  DVT  prophylaxis.  Lovenox  Time spent: 43 minutes  This record has been created using Conservation officer, historic buildings. Errors have been sought and corrected,but may not always be located. Such creation errors do not reflect  on the standard of care.   Author: Amaryllis Dare, MD 06/02/2024 4:40 PM  For on call review [email or web address].

## 2024-06-02 NOTE — Plan of Care (Signed)

## 2024-06-02 NOTE — Progress Notes (Signed)
 Palliative Care Progress Note, Assessment & Plan   Patient Name: Felicia Weaver       Date: 06/02/2024 DOB: 1925/08/18  Age: 89 y.o. MRN#: [id number] Attending Physician: Caleen Qualia, MD Primary Care Physician: Bertrum Charlie CROME, MD Admit Date: 05/30/2024  Subjective: Patient reports feeling okay today.  Was able to eat some of her breakfast.  Does not remember if she slept well last night.  Complains of 5/10 left shoulder pain with pain medication.  HPI: 89 y.o. female  with past medical history significant for essential hypertension, HLD, squamous cell carcinoma of the skin, CKD stage III, glaucoma, history of right shoulder injury with limited range of motion. Patient presented to ED 05/30/2024 c/o from [facility] for further evaluation after fall resulting in comminuted, displaced and impacted left proximal humeral head fracture.   ED labs showed BUN 24, creatinine 1.59, GFR 29, RBC 3.61 and Hgb 11.1.  Labs stable and around patient's baseline.   ED vitals 151/56, HR 65, RR 18, SpO2 98% RA and 98 F   Orthopedic surgery evaluated and recommended conservative management with wearing sling and following up with outpatient orthopedic surgery.   TRH was consulted for admission and management of pain control for closed fracture of left proximal humeral head fracture.   Palliative medicine was consulted for assistance with goals of care conversations.  Summary of counseling/coordination of care: Extensive chart review completed prior to meeting patient including labs, vital signs, imaging, progress notes, orders, and available advanced directive documents from current and previous encounters.   After reviewing the patient's chart and assessing the patient at bedside, I spoke  with patient and daughter in  regards to symptom management and goals of care.   Ill-appearing, elderly female lying in bed.  She awakens to verbal stimuli, acknowledges my presence and able to participate in conversation.  She is alert and oriented to self, location and able to identify her daughter at bedside.  She is not alert to time or situation.  Respirations are even and unlabored.  She is in no distress.  Patient's daughter at bedside expresses concern about her mother's ability to be able to rehabilitate after she transfers to [facility].  Discussed that she should make contact with social work at facility to plan ahead if patient does not respond well to rehab, what next steps would be and ask for assistance about placement in long-term care.  She was also advised to inquire about resources for palliative/hospice care at [facility] as they do not permit outside hospice agencies into facility.  Therapeutic silence and active listening provided for patient and daughter to share their thoughts and emotions regarding current medical situation.  Emotional support provided.  Physical Exam Vitals reviewed.  Constitutional:      General: She is not in acute distress.    Appearance: She is ill-appearing.  HENT:     Head: Normocephalic and atraumatic.     Mouth/Throat:     Mouth: Mucous membranes are moist.  Pulmonary:     Effort: Pulmonary effort is normal. No respiratory distress.  Musculoskeletal:     Right lower leg: No edema.     Left lower leg: No edema.  Comments: Left arm sling in place  Neurological:     Mental Status: She is alert. She is disoriented.     Motor: Weakness present.  Psychiatric:        Mood and Affect: Mood normal.        Behavior: Behavior normal.    Recommendations/Plan: DNR/DNI Continue current supportive interventions Plan to d/c to [facility] for rehab Plan to receive outpatient palliative services at facility      Total Time 65 minutes   Time spent includes:  Detailed review of medical records (labs, imaging, vital signs), medically appropriate exam (mental status, respiratory, cardiac, skin), discussed with treatment team, counseling and educating patient, family and staff, documenting clinical information, medication management and coordination of care.     Devere Sacks, AMANDA [facility] Palliative Medicine Team  06/02/2024 8:49 AM  Office [phone]  Pager [phone]

## 2024-06-03 DIAGNOSIS — N1832 Chronic kidney disease, stage 3b: Secondary | ICD-10-CM | POA: Diagnosis not present

## 2024-06-03 DIAGNOSIS — Z66 Do not resuscitate: Secondary | ICD-10-CM

## 2024-06-03 DIAGNOSIS — Z789 Other specified health status: Secondary | ICD-10-CM | POA: Diagnosis not present

## 2024-06-03 DIAGNOSIS — S42292A Other displaced fracture of upper end of left humerus, initial encounter for closed fracture: Secondary | ICD-10-CM | POA: Diagnosis not present

## 2024-06-03 DIAGNOSIS — Z7189 Other specified counseling: Secondary | ICD-10-CM | POA: Diagnosis not present

## 2024-06-03 DIAGNOSIS — Z515 Encounter for palliative care: Secondary | ICD-10-CM | POA: Diagnosis not present

## 2024-06-03 DIAGNOSIS — F32A Depression, unspecified: Secondary | ICD-10-CM | POA: Diagnosis not present

## 2024-06-03 DIAGNOSIS — W19XXXA Unspecified fall, initial encounter: Secondary | ICD-10-CM | POA: Diagnosis not present

## 2024-06-03 MED ORDER — ENSURE PLUS HIGH PROTEIN PO LIQD
237.0000 mL | Freq: Three times a day (TID) | ORAL | Status: DC
Start: 2024-06-03 — End: 2024-06-04
  Administered 2024-06-03 – 2024-06-04 (×4): 237 mL via ORAL

## 2024-06-03 MED ORDER — BISACODYL 10 MG RE SUPP
10.0000 mg | Freq: Every day | RECTAL | Status: DC | PRN
  Administered 2024-06-03: 10 mg via RECTAL
  Filled 2024-06-03: qty 1

## 2024-06-03 NOTE — Progress Notes (Signed)
 Palliative Care Progress Note, Assessment & Plan   Patient Name: Felicia Weaver       Date: 06/03/2024 DOB: 07/17/1925  Age: 89 y.o. MRN#: [id number] Attending Physician: Caleen Qualia, MD Primary Care Physician: Bertrum Charlie CROME, MD Admit Date: 05/30/2024  Subjective: Denies pain. Reports eating breakfast this am. No BM since 9/17 per RN, medications given.   HPI: 89 y.o. female  with past medical history significant for essential hypertension, HLD, squamous cell carcinoma of the skin, CKD stage III, glaucoma, history of right shoulder injury with limited range of motion. Patient presented to ED 05/30/2024 c/o from [facility] for further evaluation after fall resulting in comminuted, displaced and impacted left proximal humeral head fracture.   ED labs showed BUN 24, creatinine 1.59, GFR 29, RBC 3.61 and Hgb 11.1.  Labs stable and around patient's baseline.   ED vitals 151/56, HR 65, RR 18, SpO2 98% RA and 98 F   Orthopedic surgery evaluated and recommended conservative management with wearing sling and following up with outpatient orthopedic surgery.   TRH was consulted for admission and management of pain control for closed fracture of left proximal humeral head fracture.   Palliative medicine was consulted for assistance with goals of care conversations.  Summary of counseling/coordination of care: Extensive chart review completed prior to meeting patient including labs, vital signs, imaging, progress notes, orders, and available advanced directive documents from current and previous encounters.   After reviewing the patient's chart and assessing the patient at bedside, I spoke with patient and granddaughter in regards to symptom management and goals of care.   Il-appearing, elderly female resting  in bed. She is more lethargic today. She awakens to verbal stimuli but immediately falls back to sleep. She is not able to participate in GOC conversation. Respirations are even and unlabored. She is in no distress. Granddaughter at bedside.   Delon, granddaughter, approached me in hall who asks to accompany her to room as she has many questions about her grandmothers care. She shared that her grandmother was more sleepy today than normal and wanted to review MAR to see what had been given. Advised that patient had only received a few medications today, none of them sedating. She was concerned that narcotics had been given. She states she only wants patient to have Tylenol  during the day and narcotics if needed at night. Advised that nursing staff has followed that plan. Dr. Caleen was present during some of visit and discussed plan for treating constipation and giving nutritional drink such as Boost or Ensure. Delon agrees with this plan.   Delon shares concern if patient goes to rehab and does not do well, what would happen. Myles Delon to speak with social work or Child psychotherapist at [facility] to plan ahead with how to proceed in different scenarios if her grandmother does well or not responsive to rehabilitation. Plan is to still seek OP palliative services at [facility] and after discharge from facility.   Therapeutic silence and active listening provided for family to share their thoughts and emotions regarding current medical situation.  Emotional support provided.  Physical Exam Vitals reviewed.  Constitutional:      General: She is not in  acute distress.    Appearance: She is ill-appearing.  HENT:     Head: Normocephalic and atraumatic.     Mouth/Throat:     Mouth: Mucous membranes are dry.  Pulmonary:     Effort: Pulmonary effort is normal. No respiratory distress.  Musculoskeletal:     Right lower leg: No edema.     Left lower leg: No edema.     Comments: Left arm  sling in place  Skin:    General: Skin is warm and dry.  Neurological:     Mental Status: She is disoriented.     Motor: Weakness present.  Psychiatric:        Mood and Affect: Mood normal.        Behavior: Behavior normal.     Recommendations/Plan: DNR/DNI Continue current supportive interventions Plan to d/c to [facility] for rehab Plan to receive outpatient palliative services at facility            Total Time 65 minutes   Plan of care discussed with Dr, Caleen and primary RN  Time spent includes: Detailed review of medical records (labs, imaging, vital signs), medically appropriate exam (mental status, respiratory, cardiac, skin), discussed with treatment team, counseling and educating patient, family and staff, documenting clinical information, medication management and coordination of care.     Devere Sacks, ELNITA- [facility] Palliative Medicine Team  06/03/2024 12:21 PM  Office [phone]  Pager [phone]

## 2024-06-03 NOTE — Progress Notes (Signed)
 Physical Therapy Treatment Patient Details Name: Felicia Weaver MRN: [id number] DOB: 09-11-1925 Today's Date: 06/03/2024   History of Present Illness Pt is a 89 y.o. female with PMHx significant of essential HTN, HLD, squamous cell carcinoma of the skin, glaucoma, history of right shoulder injury with limitation of range of motion who presented to the ER with mechanical fall at home falling onto her left side and having significant proximal humeral head fracture.  Patient went to see her orthopedics at [facility] and is not a surgical candidate.  She was sent to the ER for further evaluation and possible admission and placement.    PT Comments  Pt in bed, awake and wanting to get up.  She is assisted to sitting EOB with mod a x 1 and HOB raised.  She is steady in sitting.  She struggles to stand today despite mod/max a x 1 and feet braced.  +2 is called to transfer to [facility] and while she does take a few very small steps to [facility], bed is moved back and BSC brought closer to she can sit to void.  Care provided  and she again struggles to step and reverse is done to transition safely to bed.  Pt is returned to bed given poor transfer quality today for pt and staff safety.  Positioned for comfort in bed with sling donned, pillow support and safety in place.     If plan is discharge home, recommend the following: Assistance with cooking/housework;Assistance with feeding;Direct supervision/assist for medications management;Supervision due to cognitive status;Assist for transportation;Help with stairs or ramp for entrance;Two people to help with walking and/or transfers;Two people to help with bathing/dressing/bathroom   Can travel by private vehicle        Equipment Recommendations       Recommendations for Other Services       Precautions / Restrictions Precautions Precautions: Fall Recall of Precautions/Restrictions: Impaired Required Braces or Orthoses: Sling Restrictions Weight Bearing  Restrictions Per Provider Order: Yes LUE Weight Bearing Per Provider Order: Non weight bearing Other Position/Activity Restrictions: Given proximal humeral fracture assuming NWBing for LUE in sling, family denies knowledge of WBing or ROM restrictions from outpatient evaluation prior to ED     Mobility  Bed Mobility Overal bed mobility: Needs Assistance Bed Mobility: Supine to Sit     Supine to sit: Mod assist, Max assist, HOB elevated       Patient Response: Cooperative  Transfers Overall transfer level: Needs assistance Equipment used: 2 person hand held assist Transfers: Sit to/from Stand, Bed to chair/wheelchair/BSC Sit to Stand: Min assist, +2 physical assistance   Step pivot transfers: +2 physical assistance, Min assist       General transfer comment: has difficulty stepping today and while she initiates turn to [facility] she is unable to complete, bed moved and BSC brought up to her for safety.    Ambulation/Gait         Gait velocity: decreased     General Gait Details: uanble to take any functional steps today   Stairs             Wheelchair Mobility     Tilt Bed Tilt Bed Patient Response: Cooperative  Modified Rankin (Stroke Patients Only)       Balance Overall balance assessment: Needs assistance Sitting-balance support: No upper extremity supported, Feet supported, Single extremity supported Sitting balance-Leahy Scale: Fair     Standing balance support: Single extremity supported Standing balance-Leahy Scale: Poor Standing balance comment: increased assist  today with transfers and decreased abilty to take functional steps.                            Communication Communication Communication: Impaired Factors Affecting Communication: Hearing impaired  Cognition Arousal: Alert Behavior During Therapy: WFL for tasks assessed/performed                             Following commands: Impaired Following commands  impaired: Follows one step commands inconsistently    Cueing Cueing Techniques: Verbal cues, Tactile cues, Visual cues  Exercises Other Exercises Other Exercises: to [facility] to void    General Comments        Pertinent Vitals/Pain Pain Assessment Pain Assessment: Faces Faces Pain Scale: Hurts little more Pain Location: L shoulder, No pain at rest, min to mod pain during mobility tasks Pain Descriptors / Indicators: Grimacing, Guarding Pain Intervention(s): Limited activity within patient's tolerance, Monitored during session, Repositioned    Home Living                          Prior Function            PT Goals (current goals can now be found in the care plan section) Progress towards PT goals: Progressing toward goals    Frequency    Min 2X/week      PT Plan      Co-evaluation              AM-PAC PT 6 Clicks Mobility   Outcome Measure  Help needed turning from your back to your side while in a flat bed without using bedrails?: A Lot Help needed moving from lying on your back to sitting on the side of a flat bed without using bedrails?: A Lot Help needed moving to and from a bed to a chair (including a wheelchair)?: A Lot Help needed standing up from a chair using your arms (e.g., wheelchair or bedside chair)?: A Lot Help needed to walk in hospital room?: Total Help needed climbing 3-5 steps with a railing? : Total 6 Click Score: 10    End of Session Equipment Utilized During Treatment: Gait belt;Other (comment) Activity Tolerance: Patient tolerated treatment well Patient left: in bed;with bed alarm set;with call bell/phone within reach Nurse Communication: Mobility status PT Visit Diagnosis: Unsteadiness on feet (R26.81);History of falling (Z91.81);Muscle weakness (generalized) (M62.81);Difficulty in walking, not elsewhere classified (R26.2);Pain Pain - Right/Left: Left Pain - part of body: Shoulder     Time: 1025-1040 PT Time  Calculation (min) (ACUTE ONLY): 15 min  Charges:    $Therapeutic Activity: 8-22 mins PT General Charges $$ ACUTE PT VISIT: 1 Visit                   Lauraine Gills, PTA 06/03/24, 11:38 AM

## 2024-06-03 NOTE — Assessment & Plan Note (Signed)
 Mechanical fall resulted in left femoral fracture and large laceration of left arm requiring suturing. - PT is recommending SNF -TOC consult for placement

## 2024-06-03 NOTE — TOC Progression Note (Signed)
 Transition of Care [facility]) - Progression Note    Patient Details  Name: Felicia Weaver MRN: [id number] Date of Birth: 1925/05/16  Transition of Care [facility]) CM/SW Contact  [place] L Colette [place], [place] Phone Number: 06/03/2024, 11:41 AM  Clinical Narrative:     Shara approved for SNF at [facility]. SNF auth: [id number] Ambulance: [id number]                       Expected Discharge Plan and Services                                               Social Drivers of Health (SDOH) Interventions SDOH Screenings   Food Insecurity: No Food Insecurity (05/31/2024)  Housing: Low Risk  (05/31/2024)  Transportation Needs: No Transportation Needs (05/31/2024)  Utilities: Not At Risk (05/31/2024)  Alcohol Screen: Low Risk  (03/03/2020)  Depression (PHQ2-9): Low Risk  (03/03/2020)  Financial Resource Strain: Low Risk  (11/15/2023)   Received from [facility]  Physical Activity: Inactive (03/03/2020)  Social Connections: Moderately Isolated (05/31/2024)  Stress: No Stress Concern Present (03/03/2020)  Tobacco Use: Low Risk  (05/30/2024)    Readmission Risk Interventions     No data to display

## 2024-06-03 NOTE — Plan of Care (Signed)
  Problem: Clinical Measurements: °Goal: Will remain free from infection °Outcome: Progressing °  °Problem: Clinical Measurements: °Goal: Respiratory complications will improve °Outcome: Progressing °  °Problem: Clinical Measurements: °Goal: Cardiovascular complication will be avoided °Outcome: Progressing °  °Problem: Activity: °Goal: Risk for activity intolerance will decrease °Outcome: Progressing °  °Problem: Nutrition: °Goal: Adequate nutrition will be maintained °Outcome: Progressing °  °

## 2024-06-03 NOTE — Progress Notes (Signed)
 Progress Note   Patient: Felicia Weaver FMW:[id number] DOB: 10-06-1924 DOA: 05/30/2024     4 DOS: the patient was seen and examined on 06/03/2024   Brief hospital course: Partly taken from H&P.  Felicia Weaver is a 89 y.o. female with medical history significant of essential hypertension, hyperlipidemia, squamous cell carcinoma of the skin, glaucoma, history of right shoulder injury with limitation of range of motion who presented to the ER with mechanical fall today at home falling onto her left side and resulted in comminuted, displaced and impacted proximal humeral head fracture.  Patient went to see her orthopedics at [facility] today where she was evaluated.  She is not a surgical candidate.  She was sent over to the ER for further evaluation and possible admission and placement.  She is unable to use both arms.    On presentation vitals and labs seem stable around her baseline.  Imaging as mentioned above for humeral fracture, no bony lesions involving left forearm.  Patient was admitted for pain control, PT and OT evaluation ordered.  9/18: Vital stable, slight decrease in hemoglobin to 9.8, renal function within baseline of 1.3-1.5. PT is recommending SNF. Patient currently full code-palliative care was consulted to discuss goals of care.  9/19: Remained hemodynamically stable, CODE STATUS changed to DNR.  CT head ordered due to daughter's concern of any underlying stroke as patient was complaining of decreased in vision. Pending insurance authorization for [employer].  9/20: Remained stable, awaiting insurance authorization.  CT head was negative for any acute abnormality.  9/21: Hemodynamically stable, little more somnolent this morning but ate breakfast.  Obtain insurance authorization to go to rehab tomorrow.  Assessment and Plan: * Fall Mechanical fall resulted in left femoral fracture and large laceration of left arm requiring suturing. - PT is recommending SNF -TOC  consult for placement  Closed fracture of upper end of humerus Imaging with concern of communicated, displaced proximal femoral fracture. Was evaluated by orthopedic surgery and they are recommending conservative management.  Patient will remain in sling and follow-up with outpatient orthopedic surgery. - Continue with PT and OT -Continue with supportive care and pain management  Essential hypertension Patient was on Lasix and losartan  at home. Blood pressure currently within goal. - Holding home antihypertensives-we will restart once needed  Stage 3 chronic kidney disease (HCC) Creatinine around baseline, seems like underlying CKD stage IIIb with baseline around 1.3-1.6.  Did not meet criteria for AKI-AKI ruled out - Continue to monitor -Avoid nephrotoxins  Depression - Continue home Zoloft    Subjective: Patient was sleeping comfortably this morning. Ate part of her breakfast.  Daughter at bedside and requesting Ensure which was ordered.  No BM for the past 2 days.  Physical Exam: Vitals:   06/02/24 1628 06/02/24 2036 06/03/24 0452 06/03/24 0723  BP: (!) 149/56 134/66 (!) 142/57 (!) 150/60  Pulse: 70 72 77 78  Resp: 14 18 16 17   Temp: 97.6 F (36.4 C) 98.8 F (37.1 C) 98.8 F (37.1 C) 99.1 F (37.3 C)  TempSrc:   Oral Oral  SpO2: 96% 97% 96% 95%  Weight:       General.  Frail elderly lady, in no acute distress. Pulmonary.  Lungs clear bilaterally, normal respiratory effort. CV.  Regular rate and rhythm, no JVD, rub or murmur. Abdomen.  Soft, nontender, nondistended, BS positive. CNS.  Sleeping. Extremities.  No edema,  pulses intact and symmetrical.  Left upper extremity with sling  Data Reviewed: Prior data  reviewed  Family Communication: Discussed with daughters at bedside  Disposition: Status is: Inpatient Remains inpatient appropriate because: Severity of illness  Planned Discharge Destination: Skilled nursing facility  DVT prophylaxis.  Lovenox  Time  spent: 44 minutes  This record has been created using Conservation officer, historic buildings. Errors have been sought and corrected,but may not always be located. Such creation errors do not reflect on the standard of care.   Author: Amaryllis Dare, MD 06/03/2024 12:49 PM  For on call review [email or web address].

## 2024-06-03 NOTE — Progress Notes (Signed)
 Physical Therapy Treatment Patient Details Name: Felicia Weaver MRN: [id number] DOB: Dec 31, 1924 Today's Date: 06/03/2024   History of Present Illness Pt is a 89 y.o. female with PMHx significant of essential HTN, HLD, squamous cell carcinoma of the skin, glaucoma, history of right shoulder injury with limitation of range of motion who presented to the ER with mechanical fall at home falling onto her left side and having significant proximal humeral head fracture.  Patient went to see her orthopedics at [facility] and is not a surgical candidate.  She was sent to the ER for further evaluation and possible admission and placement.    PT Comments  Updated granddaughter and daughter this am in regards to therapy and mobility, transfers and answered questions.  Stated she got pt up to [facility] this am but transfer was difficulty and while she took a few steps she was unable to turn fully and surfaces needed to be changes out behind her for a safe transfer.  Transition to [facility] was fatiguing for her and decision made to put her back to bed for pt and staff safety.  Pt later was transferred out of bed to [facility] then to recliner with +3 nursing staff and generally difficulty transfer due to family request.    Called by nursing to assist with pt back to bed due to challenging transfer earlier.  Pt is in recliner.  Recliner is positioned so it she is facing away from bed.  Pt stood with mod a x 1 and cga x 1 with gait belt.  She stands and once steady, chair is removed and BSC is placed behind her.  After voiding, care is provided and BSC is removed and bed is brought up behind her so she does not take any steps during transition.  Returns to bed with mod a x 2 for comfort.  Discussed with nursing staff regarding transfers.  If she transfers with procedure listed above, it is much safer for pt and staff at this point.  She is not able to take any quality steps which puts pt and staff at risk for injury. Judgement will  need to be made on a per activity basis based on her alertness, ability to follow directions and fatigue level.  Reviewed with nursing staff if they are not comfortable with transfer then [facility] lift or bed level activities may be more appropriate.   If plan is discharge home, recommend the following: Assistance with cooking/housework;Assistance with feeding;Direct supervision/assist for medications management;Supervision due to cognitive status;Assist for transportation;Help with stairs or ramp for entrance;Two people to help with walking and/or transfers;Two people to help with bathing/dressing/bathroom   Can travel by private vehicle        Equipment Recommendations       Recommendations for Other Services       Precautions / Restrictions Precautions Precautions: Fall Recall of Precautions/Restrictions: Impaired Required Braces or Orthoses: Sling Restrictions Weight Bearing Restrictions Per Provider Order: Yes LUE Weight Bearing Per Provider Order: Non weight bearing Other Position/Activity Restrictions: Given proximal humeral fracture assuming NWBing for LUE in sling, family denies knowledge of WBing or ROM restrictions from outpatient evaluation prior to ED     Mobility  Bed Mobility Overal bed mobility: Needs Assistance Bed Mobility: Supine to Sit     Supine to sit: Mod assist, Max assist, HOB elevated     General bed mobility comments: Physical assist required for BLE and trunk management Patient Response: Cooperative  Transfers Overall transfer level: Needs assistance Equipment  used: 2 person hand held assist Transfers: Sit to/from Stand Sit to Stand: Mod assist   Step pivot transfers: +2 physical assistance, Min assist       General transfer comment: uanble to take safe functional steps    Ambulation/Gait         Gait velocity: decreased     General Gait Details: uanble to take any functional steps today   Stairs             Wheelchair  Mobility     Tilt Bed Tilt Bed Patient Response: Cooperative  Modified Rankin (Stroke Patients Only)       Balance Overall balance assessment: Needs assistance Sitting-balance support: No upper extremity supported, Feet supported, Single extremity supported Sitting balance-Leahy Scale: Fair     Standing balance support: Single extremity supported Standing balance-Leahy Scale: Poor Standing balance comment: increased assist today with transfers and decreased abilty to take functional steps.                            Communication Communication Communication: Impaired Factors Affecting Communication: Hearing impaired  Cognition Arousal: Alert Behavior During Therapy: WFL for tasks assessed/performed   PT - Cognitive impairments: History of cognitive impairments                         Following commands: Impaired Following commands impaired: Follows one step commands inconsistently    Cueing Cueing Techniques: Verbal cues, Tactile cues, Visual cues  Exercises Other Exercises Other Exercises: to [facility] to void    General Comments        Pertinent Vitals/Pain Pain Assessment Pain Assessment: Faces Faces Pain Scale: Hurts little more Pain Location: L shoulder, No pain at rest, min to mod pain during mobility tasks Pain Descriptors / Indicators: Grimacing, Guarding Pain Intervention(s): Limited activity within patient's tolerance, Monitored during session, Repositioned    Home Living                          Prior Function            PT Goals (current goals can now be found in the care plan section) Progress towards PT goals: Progressing toward goals    Frequency    Min 2X/week      PT Plan      Co-evaluation              AM-PAC PT 6 Clicks Mobility   Outcome Measure  Help needed turning from your back to your side while in a flat bed without using bedrails?: A Lot Help needed moving from lying on your back  to sitting on the side of a flat bed without using bedrails?: A Lot Help needed moving to and from a bed to a chair (including a wheelchair)?: A Lot Help needed standing up from a chair using your arms (e.g., wheelchair or bedside chair)?: A Lot Help needed to walk in hospital room?: Total Help needed climbing 3-5 steps with a railing? : Total 6 Click Score: 10    End of Session Equipment Utilized During Treatment: Gait belt;Other (comment) Activity Tolerance: Patient tolerated treatment well Patient left: in bed;with bed alarm set;with call bell/phone within reach Nurse Communication: Mobility status PT Visit Diagnosis: Unsteadiness on feet (R26.81);History of falling (Z91.81);Muscle weakness (generalized) (M62.81);Difficulty in walking, not elsewhere classified (R26.2);Pain Pain - Right/Left: Left Pain - part of body: Shoulder  Time: 8487-8477 PT Time Calculation (min) (ACUTE ONLY): 10 min  Charges:    $Therapeutic Activity: 8-22 mins PT General Charges $$ ACUTE PT VISIT: 1 Visit                   Lauraine Gills, PTA 06/03/24, 4:15 PM

## 2024-06-04 DIAGNOSIS — N1832 Chronic kidney disease, stage 3b: Secondary | ICD-10-CM | POA: Diagnosis not present

## 2024-06-04 DIAGNOSIS — S42292A Other displaced fracture of upper end of left humerus, initial encounter for closed fracture: Secondary | ICD-10-CM | POA: Diagnosis not present

## 2024-06-04 DIAGNOSIS — I10 Essential (primary) hypertension: Secondary | ICD-10-CM

## 2024-06-04 DIAGNOSIS — W19XXXA Unspecified fall, initial encounter: Secondary | ICD-10-CM | POA: Diagnosis not present

## 2024-06-04 MED ORDER — ONDANSETRON HCL 4 MG PO TABS: 4.0000 mg | ORAL_TABLET | Freq: Four times a day (QID) | ORAL | Status: AC | PRN

## 2024-06-04 MED ORDER — ENSURE PLUS HIGH PROTEIN PO LIQD: 237.0000 mL | Freq: Three times a day (TID) | ORAL | Status: AC

## 2024-06-04 MED ORDER — MAGNESIUM OXIDE -MG SUPPLEMENT 400 (240 MG) MG PO TABS: 200.0000 mg | ORAL_TABLET | Freq: Every day | ORAL | Status: AC

## 2024-06-04 MED ORDER — BISACODYL 10 MG RE SUPP: 10.0000 mg | Freq: Every day | RECTAL | Status: AC | PRN

## 2024-06-04 MED ORDER — LOSARTAN POTASSIUM 25 MG PO TABS
25.0000 mg | ORAL_TABLET | Freq: Every day | ORAL | Status: DC
  Administered 2024-06-04: 25 mg via ORAL
  Filled 2024-06-04: qty 1

## 2024-06-04 MED ORDER — HYPROMELLOSE (GONIOSCOPIC) 2.5 % OP SOLN: 1.0000 [drp] | OPHTHALMIC | Status: AC | PRN

## 2024-06-04 MED ORDER — LACTULOSE 10 GM/15ML PO SOLN: 30.0000 g | Freq: Two times a day (BID) | ORAL | Status: AC

## 2024-06-04 NOTE — Discharge Summary (Signed)
 Physician Discharge Summary   Patient: Felicia Weaver MRN: [id number] DOB: September 06, 1925  Admit date:     05/30/2024  Discharge date: 06/04/24  Discharge Physician: Amaryllis Dare   PCP: Bertrum Charlie CROME, MD   Recommendations at discharge:  Please obtain CBC and BMP and follow-up Follow-up with orthopedic surgery Follow-up with primary care provider  Discharge Diagnoses: Principal Problem:   Fall Active Problems:   Closed fracture of upper end of humerus   Essential hypertension   Stage 3 chronic kidney disease [facility])   Depression   Hospital Course: Partly taken from H&P.  Felicia Weaver is a 89 y.o. female with medical history significant of essential hypertension, hyperlipidemia, squamous cell carcinoma of the skin, glaucoma, history of right shoulder injury with limitation of range of motion who presented to the ER with mechanical fall today at home falling onto her left side and resulted in comminuted, displaced and impacted proximal humeral head fracture.  Patient went to see her orthopedics at [facility] today where she was evaluated.  She is not a surgical candidate.  She was sent over to the ER for further evaluation and possible admission and placement.  She is unable to use both arms.    On presentation vitals and labs seem stable around her baseline.  Imaging as mentioned above for humeral fracture, no bony lesions involving left forearm.  Patient was admitted for pain control, PT and OT evaluation ordered.  9/18: Vital stable, slight decrease in hemoglobin to 9.8, renal function within baseline of 1.3-1.5. PT is recommending SNF. Patient currently full code-palliative care was consulted to discuss goals of care.  9/19: Remained hemodynamically stable, CODE STATUS changed to DNR.  CT head ordered due to daughter's concern of any underlying stroke as patient was complaining of decreased in vision. Pending insurance authorization for [employer].  9/20: Remained  stable, awaiting insurance authorization.  CT head was negative for any acute abnormality.  9/21: Hemodynamically stable, little more somnolent this morning but ate breakfast.  Obtain insurance authorization to go to rehab tomorrow.  9/22: Patient remained hemodynamically stable, blood pressure now started trending up so restarting home losartan .  She will resume her 3 times weekly Lasix and home medications on discharge.  Patient does not need any opioids for pain control so they were not prescribed and family also does not want any sedating medications.  Her physical therapist recommended SNF where she is being discharged for rehab.  She will continue on current medications and need to have a close follow-up with her providers for further assistance.  Assessment and Plan: * Fall Mechanical fall resulted in left humeral fracture and large laceration of left arm requiring suturing. - PT is recommending SNF  Closed fracture of upper end of humerus Imaging with concern of communicated, displaced proximal femoral fracture. Was evaluated by orthopedic surgery and they are recommending conservative management.  Patient will remain in sling and follow-up with outpatient orthopedic surgery. - Continue with PT and OT -Continue with supportive care and pain management  Essential hypertension Patient was on Lasix and losartan  at home. Initially blood pressure was soft so home antihypertensives were held which will be restarted on discharge.  Stage 3 chronic kidney disease (HCC) Creatinine around baseline, seems like underlying CKD stage IIIb with baseline around 1.3-1.6.  Did not meet criteria for AKI-AKI ruled out - Continue to monitor -Avoid nephrotoxins  Depression - Continue home Zoloft   Consultants: Orthopedic surgery Procedures performed: None Disposition: Skilled nursing facility Diet  recommendation:  Regular diet DISCHARGE MEDICATION: Allergies as of 06/04/2024       Reactions    Betagan  [levobunolol Hcl] Other (See Comments)   Not Assessed   Brimonidine Tartrate Other (See Comments)   Not Assessed Itching eyes   Other Other (See Comments)   PROPINE. PROPINE -itching eyes   Sulfa Antibiotics Nausea Only        Medication List     STOP taking these medications    MAGNESIUM  PO   mometasone  0.1 % cream Commonly known as: Elocon        TAKE these medications    acetaminophen  500 MG tablet Commonly known as: TYLENOL  Take 500 mg by mouth every 6 (six) hours as needed.   bisacodyl  10 MG suppository Commonly known as: DULCOLAX Place 1 suppository (10 mg total) rectally daily as needed for moderate constipation.   feeding supplement Liqd Take 237 mLs by mouth 3 (three) times daily between meals.   furosemide 40 MG tablet Commonly known as: LASIX Take 40 mg by mouth daily.   hydroxypropyl methylcellulose / hypromellose 2.5 % ophthalmic solution Commonly known as: ISOPTO TEARS / GONIOVISC Place 1 drop into both eyes as needed for dry eyes.   lactulose  10 GM/15ML solution Commonly known as: CHRONULAC  Take 45 mLs (30 g total) by mouth 2 (two) times daily.   lidocaine  5 % ointment Commonly known as: XYLOCAINE  Apply 1 Application topically as needed.   losartan  25 MG tablet Commonly known as: COZAAR  Take 25 mg by mouth daily.   magnesium  oxide 400 (240 Mg) MG tablet Commonly known as: MAG-OX Take 0.5 tablets (200 mg total) by mouth daily.   metroNIDAZOLE  0.75 % cream Commonly known as: METROCREAM  Apply to face qd-bid   ondansetron  4 MG tablet Commonly known as: ZOFRAN  Take 1 tablet (4 mg total) by mouth every 6 (six) hours as needed for nausea.   potassium chloride 10 MEQ tablet Commonly known as: KLOR-CON Take 10 mEq by mouth 2 (two) times daily.   sertraline  25 MG tablet Commonly known as: ZOLOFT  Take 25 mg by mouth daily.        Contact information for follow-up providers     Bertrum Charlie CROME, MD. Schedule an  appointment as soon as possible for a visit in 1 week(s).   Specialty: Family Medicine Contact information: [address] [place] [address] [phone]              Contact information for after-discharge care     Destination     [facility] .   Service: Skilled Nursing Contact information: [address] [place]   [phone]                    Discharge Exam: Fredricka Weights   05/30/24 2045  Weight: 65 kg   General.  Frail elderly lady, in no acute distress. Pulmonary.  Lungs clear bilaterally, normal respiratory effort. CV.  Regular rate and rhythm, no JVD, rub or murmur. Abdomen.  Soft, nontender, nondistended, BS positive. CNS.  Alert and oriented .  No focal neurologic deficit. Extremities.  No edema,  pulses intact and symmetrical.  Left upper extremity in sling.  Condition at discharge: stable  The results of significant diagnostics from this hospitalization (including imaging, microbiology, ancillary and laboratory) are listed below for reference.   Imaging Studies: CT HEAD WO CONTRAST ( ) Result Date: 06/01/2024 EXAM: CT HEAD WITHOUT CONTRAST 06/01/2024 08:07:36  PM TECHNIQUE: CT of the head was performed without the administration of intravenous contrast. Automated exposure control, iterative reconstruction, and/or weight based adjustment of the mA/kV was utilized to reduce the radiation dose to as low as reasonably achievable. COMPARISON: None available. CLINICAL HISTORY: Head trauma, moderate-severe. FINDINGS: BRAIN AND VENTRICLES: No acute hemorrhage. No evidence of acute infarct. No hydrocephalus. No extra-axial collection. No mass effect or midline shift. Chronic ischemic white matter changes. Left cerebellar small vessel infarct. Generalized volume loss. ORBITS: No acute abnormality. SINUSES: No acute abnormality. SOFT TISSUES AND SKULL: No acute soft  tissue abnormality. No skull fracture. Small incidental left middle cranial fossa arachnoid cyst. IMPRESSION: 1. No acute intracranial abnormality. 2. Small incidental left middle cranial fossa arachnoid cyst. 3. Chronic ischemic white matter changes and generalized volume loss. Electronically signed by: Franky Stanford MD 06/01/2024 08:34 PM EDT RP Workstation: [id number]   DG Forearm Left Result Date: 05/30/2024 CLINICAL DATA:  Pain after fall, skin tear. EXAM: LEFT FOREARM - 2 VIEW COMPARISON:  None Available. FINDINGS: There is no evidence of fracture or other focal bone lesions. Patient's watch partially obscures evaluation of the distal radius and ulna. Chronic calcification adjacent to the ulna styloid. No wrist or elbow dislocation. 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 Mild skin irregularity, no radiopaque foreign body. IMPRESSION: 1. No acute fracture of the left forearm. 2. Mild skin irregularity, no radiopaque foreign body. Electronically Signed   By: Andrea Gasman M.D.   On: 05/30/2024 15:26   DG Shoulder Left Result Date: 05/30/2024 CLINICAL DATA:  Shoulder pain after fall. EXAM: LEFT SHOULDER - 2+ VIEW COMPARISON:  None Available. FINDINGS: Comminuted, displaced and impacted fracture of the proximal humerus. There is a transverse component through the surgical neck. Displaced component involves the lateral humeral head. The glenohumeral joint remains congruent. The acromioclavicular joint remains congruent. No displaced rib fracture. IMPRESSION: Comminuted, displaced and impacted proximal humerus fracture. Electronically Signed   By: Andrea Gasman M.D.   On: 05/30/2024 15:24    Microbiology: Results for orders placed or performed during the hospital encounter of 07/12/17  Culture, fungus without smear     Status: None   Collection Time: 07/12/17  3:37 PM   Specimen: Eye; Other  Result Value Ref Range Status   Specimen Description EYE  Final   Special  Requests BLOOD AGAR  Final   Culture   Final    NO FUNGUS ISOLATED AFTER 21 DAYS Performed at [facility], [address], [place], [place] [address]    Report Status 08/06/2017 FINAL  Final  Aerobic Culture  (superficial specimen)     Status: Abnormal   Collection Time: 07/12/17  3:39 PM   Specimen: Wound  Result Value Ref Range Status   Specimen Description WOUND  Final   Special Requests NONE  Final   Gram Stain   Final    ABUNDANT WBC PRESENT, PREDOMINANTLY PMN ABUNDANT GRAM POSITIVE COCCI IN PAIRS Performed at [facility], [address] [place], [place] [address]    Culture STAPHYLOCOCCUS AUREUS (A)  Final   Report Status 07/16/2017 FINAL  Final   Organism ID, Bacteria STAPHYLOCOCCUS AUREUS  Final      Susceptibility   Staphylococcus aureus - MIC*    CIPROFLOXACIN <=0.5 SENSITIVE Sensitive     ERYTHROMYCIN <=0.25 SENSITIVE Sensitive     GENTAMICIN <=0.5 SENSITIVE Sensitive     OXACILLIN 0.5 SENSITIVE Sensitive     TETRACYCLINE <=1 SENSITIVE  Sensitive     VANCOMYCIN 1 SENSITIVE Sensitive     TRIMETH/SULFA <=10 SENSITIVE Sensitive     CLINDAMYCIN <=0.25 SENSITIVE Sensitive     RIFAMPIN <=0.5 SENSITIVE Sensitive     Inducible Clindamycin NEGATIVE Sensitive     * STAPHYLOCOCCUS AUREUS    Labs: CBC: Recent Labs  Lab 05/30/24 1727 05/30/24 1933 05/31/24 0336  WBC 9.9 9.0 7.5  NEUTROABS 8.7*  --   --   HGB 11.1* 10.3* 9.8*  HCT 34.6* 31.7* 30.0*  MCV 95.8 95.5 95.8  PLT 177 165 158   Basic Metabolic Panel: Recent Labs  Lab 05/30/24 1727 05/30/24 1933 05/31/24 0336  NA 136  --  136  K 5.0  --  4.3  CL 102  --  106  CO2 26  --  26  GLUCOSE 119*  --  113*  BUN 24*  --  24*  CREATININE 1.59* 1.34* 1.40*  CALCIUM 9.8  --  9.2   Liver Function Tests: Recent Labs  Lab 05/31/24 0336  AST 17  ALT 11  ALKPHOS 44  BILITOT 0.8  PROT 6.0*  ALBUMIN 3.3*   CBG: No results for input(s): GLUCAP in the last 168 hours.  Discharge time spent:  greater than 30 minutes.  This record has been created using Conservation officer, historic buildings. Errors have been sought and corrected,but may not always be located. Such creation errors do not reflect on the standard of care.   Signed: Amaryllis Dare, MD [facility] 06/04/2024

## 2024-06-04 NOTE — Plan of Care (Signed)

## 2024-06-04 NOTE — Progress Notes (Signed)
 Report called to [place] at [place] at [phone]. All questions answered.

## 2024-06-04 NOTE — Plan of Care (Signed)
   Problem: Coping: Goal: Level of anxiety will decrease Outcome: Progressing   Problem: Pain Managment: Goal: General experience of comfort will improve and/or be controlled Outcome: Progressing   Problem: Safety: Goal: Ability to remain free from injury will improve Outcome: Progressing

## 2024-06-04 NOTE — Progress Notes (Signed)
 Occupational Therapy Treatment Patient Details Name: Felicia Weaver MRN: [id number] DOB: 03/05/1925 Today's Date: 06/04/2024   History of present illness Pt is a 89 y.o. female with PMHx significant of essential HTN, HLD, squamous cell carcinoma of the skin, glaucoma, history of right shoulder injury with limitation of range of motion who presented to the ER with mechanical fall at home falling onto her left side and having significant proximal humeral head fracture, not a surgical candidate.   OT comments  Felicia Weaver was seen for OT treatment on this date. Upon arrival to room pt in bed, agreeable to tx. Pt requires MAX A exit bed. MOD A sit<>stand from bed height, BSC height, and chair height. Increased fearfulness with mobility noted upon attempts to take steps requiring MAX A x2 for bed>BSC>chair pivot t/fs. MAX A don/doff gown and sling in sitting. Reviewed seated HEP with pt and family. Pt making good progress toward goals, will continue to follow POC. Discharge recommendation remains appropriate.       If plan is discharge home, recommend the following:  A lot of help with walking and/or transfers;A lot of help with bathing/dressing/bathroom;Assistance with cooking/housework;Assist for transportation;Direct supervision/assist for medications management;Supervision due to cognitive status;Help with stairs or ramp for entrance   Equipment Recommendations  BSC/3in1    Recommendations for Other Services      Precautions / Restrictions Precautions Precautions: Fall Recall of Precautions/Restrictions: Impaired Required Braces or Orthoses: Sling Restrictions Weight Bearing Restrictions Per Provider Order: Yes LUE Weight Bearing Per Provider Order: Non weight bearing       Mobility Bed Mobility Overal bed mobility: Needs Assistance Bed Mobility: Supine to Sit     Supine to sit: Max assist          Transfers Overall transfer level: Needs assistance Equipment used: 1 person hand  held assist Transfers: Bed to chair/wheelchair/BSC, Sit to/from Stand Sit to Stand: Mod assist Stand pivot transfers: Max assist, +2 safety/equipment               Balance Overall balance assessment: Needs assistance Sitting-balance support: No upper extremity supported, Feet supported, Single extremity supported Sitting balance-Leahy Scale: Fair     Standing balance support: Single extremity supported Standing balance-Leahy Scale: Poor                             ADL either performed or assessed with clinical judgement   ADL Overall ADL's : Needs assistance/impaired                                       General ADL Comments: MAX A for BSC t/f, +2 pericare standing. MAX A don/doff gown and sling in sitting     Communication Communication Communication: Impaired Factors Affecting Communication: Hearing impaired   Cognition Arousal: Alert Behavior During Therapy: WFL for tasks assessed/performed Cognition: Cognition impaired   Orientation impairments: Situation                           Following commands: Impaired Following commands impaired: Follows one step commands with increased time      Cueing   Cueing Techniques: Verbal cues, Tactile cues, Visual cues  Exercises Exercises: General Lower Extremity General Exercises - Lower Extremity Ankle Circles/Pumps: AROM, Strengthening, Both, 5 reps, Seated Gluteal Sets: AROM, Strengthening, Both, 5 reps, Seated  Long Arc Quad: AROM, Strengthening, Both, 5 reps, Seated Hip Flexion/Marching: AROM, Strengthening, Both, 5 reps, Seated    Shoulder Instructions       General Comments      Pertinent Vitals/ Pain       Pain Assessment Pain Assessment: Faces Faces Pain Scale: Hurts little more Pain Location: L shouulder with PROM for sling mgmt Pain Descriptors / Indicators: Grimacing, Guarding Pain Intervention(s): Limited activity within patient's tolerance,  Repositioned   Frequency  Min 2X/week        Progress Toward Goals  OT Goals(current goals can now be found in the care plan section)  Progress towards OT goals: Progressing toward goals  Acute Rehab OT Goals OT Goal Formulation: With patient/family Time For Goal Achievement: 06/14/24 Potential to Achieve Goals: Good ADL Goals Pt Will Transfer to Toilet: with supervision;ambulating Pt Will Perform Toileting - Clothing Manipulation and hygiene: with supervision;sitting/lateral leans;sit to/from stand Additional ADL Goal #1: Pt will independently instruct family/caregivers in sling mgt  Plan      Co-evaluation                 AM-PAC OT 6 Clicks Daily Activity     Outcome Measure   Help from another person eating meals?: None Help from another person taking care of personal grooming?: A Little Help from another person toileting, which includes using toliet, bedpan, or urinal?: A Lot Help from another person bathing (including washing, rinsing, drying)?: A Lot Help from another person to put on and taking off regular upper body clothing?: A Lot Help from another person to put on and taking off regular lower body clothing?: A Lot 6 Click Score: 15    End of Session Equipment Utilized During Treatment: Gait belt  OT Visit Diagnosis: Unsteadiness on feet (R26.81);Repeated falls (R29.6);Muscle weakness (generalized) (M62.81)   Activity Tolerance Patient tolerated treatment well   Patient Left in chair;with call bell/phone within reach;with chair alarm set;with family/visitor present   Nurse Communication          Time: [id number] OT Time Calculation (min): 32 min  Charges: OT General Charges $OT Visit: 1 Visit OT Treatments $Self Care/Home Management : 23-37 mins  Felicia Weaver, M.S. OTR/L  06/04/24, 9:52 AM  ascom [phone]

## 2024-06-04 NOTE — TOC Transition Note (Signed)
 Transition of Care [facility]) - Discharge Note   Patient Details  Name: Felicia Weaver MRN: [id number] Date of Birth: May 02, 1925  Transition of Care [facility]) CM/SW Contact:  Alvaro Louder, LCSW Phone Number: 06/04/2024, 4:40 PM   Clinical Narrative:   LCSWA received insurance approval for patient to admit to SNF [facility]. LCSWA confirmed with MD that patient is stable for discharge. LCSWA notified the patient and they are in agreement with discharge. LCSWA confirmed bed is available at SNF. Transport arranged with [facility] for next available.  RM 211b, Number to call report:  [phone]          Patient Goals and CMS Choice            Discharge Placement                       Discharge Plan and Services Additional resources added to the After Visit Summary for                                       Social Drivers of Health (SDOH) Interventions SDOH Screenings   Food Insecurity: No Food Insecurity (05/31/2024)  Housing: Low Risk  (05/31/2024)  Transportation Needs: No Transportation Needs (05/31/2024)  Utilities: Not At Risk (05/31/2024)  Alcohol Screen: Low Risk  (03/03/2020)  Depression (PHQ2-9): Low Risk  (03/03/2020)  Financial Resource Strain: Low Risk  (11/15/2023)   Received from [facility]  Physical Activity: Inactive (03/03/2020)  Social Connections: Moderately Isolated (05/31/2024)  Stress: No Stress Concern Present (03/03/2020)  Tobacco Use: Low Risk  (05/30/2024)     Readmission Risk Interventions     No data to display
